# Patient Record
Sex: Female | Born: 1965 | Hispanic: No | Marital: Married | Smoking: Never smoker
Health system: Southern US, Community
[De-identification: ages and names within clinical notes are randomized; demographics above are authoritative.]

## PROBLEM LIST (undated history)

## (undated) DIAGNOSIS — R319 Hematuria, unspecified: Secondary | ICD-10-CM

## (undated) DIAGNOSIS — E785 Hyperlipidemia, unspecified: Secondary | ICD-10-CM

## (undated) DIAGNOSIS — E559 Vitamin D deficiency, unspecified: Secondary | ICD-10-CM

## (undated) DIAGNOSIS — L858 Other specified epidermal thickening: Secondary | ICD-10-CM

## (undated) DIAGNOSIS — E039 Hypothyroidism, unspecified: Secondary | ICD-10-CM

## (undated) HISTORY — DX: Other specified epidermal thickening: L85.8

## (undated) HISTORY — DX: Hematuria, unspecified: R31.9

## (undated) HISTORY — DX: Hyperlipidemia, unspecified: E78.5

## (undated) HISTORY — DX: Vitamin D deficiency, unspecified: E55.9

## (undated) HISTORY — DX: Hypothyroidism, unspecified: E03.9

---

## 2013-03-18 DIAGNOSIS — Z8639 Personal history of other endocrine, nutritional and metabolic disease: Secondary | ICD-10-CM | POA: Insufficient documentation

## 2013-10-14 LAB — HM MAMMOGRAPHY: HM MAMMO: NORMAL

## 2013-10-16 LAB — HM PAP SMEAR: HM Pap smear: NORMAL

## 2013-10-21 LAB — LIPID PANEL
Cholesterol: 188 mg/dL (ref 0–200)
HDL: 46 mg/dL (ref 35–70)
LDL Cholesterol: 92 mg/dL
Triglycerides: 252 mg/dL — AB (ref 40–160)

## 2014-10-13 ENCOUNTER — Telehealth: Payer: Self-pay | Admitting: Family Medicine

## 2014-10-13 ENCOUNTER — Other Ambulatory Visit: Payer: Self-pay | Admitting: Family Medicine

## 2014-10-13 DIAGNOSIS — Z79899 Other long term (current) drug therapy: Secondary | ICD-10-CM

## 2014-10-13 DIAGNOSIS — E785 Hyperlipidemia, unspecified: Secondary | ICD-10-CM

## 2014-10-13 DIAGNOSIS — E033 Postinfectious hypothyroidism: Secondary | ICD-10-CM

## 2014-10-13 DIAGNOSIS — E559 Vitamin D deficiency, unspecified: Secondary | ICD-10-CM

## 2014-10-13 NOTE — Telephone Encounter (Signed)
Patient is requesting lab work be done before physical, can you please order.

## 2014-10-13 NOTE — Telephone Encounter (Signed)
Pt is scheduled for her annual physical next week and is requesting to do her lab work this week. Please call when lab order is ready thank you  [phone]

## 2014-10-16 ENCOUNTER — Other Ambulatory Visit: Payer: Self-pay | Admitting: Family Medicine

## 2014-10-17 ENCOUNTER — Encounter: Payer: Self-pay | Admitting: Family Medicine

## 2014-10-17 LAB — COMPREHENSIVE METABOLIC PANEL
ALBUMIN: 4.2 g/dL (ref 3.5–5.5)
ALK PHOS: 54 IU/L (ref 39–117)
ALT: 21 IU/L (ref 0–32)
AST: 15 IU/L (ref 0–40)
Albumin/Globulin Ratio: 1.7 (ref 1.1–2.5)
BUN / CREAT RATIO: 22 (ref 9–23)
BUN: 14 mg/dL (ref 6–24)
Bilirubin Total: 0.4 mg/dL (ref 0.0–1.2)
CHLORIDE: 104 mmol/L (ref 97–108)
CO2: 21 mmol/L (ref 18–29)
Calcium: 9.2 mg/dL (ref 8.7–10.2)
Creatinine, Ser: 0.63 mg/dL (ref 0.57–1.00)
GFR calc Af Amer: 122 mL/min/{1.73_m2} (ref >59)
GFR, EST NON AFRICAN AMERICAN: 106 mL/min/{1.73_m2} (ref >59)
Globulin, Total: 2.5 g/dL (ref 1.5–4.5)
Glucose: 84 mg/dL (ref 65–99)
POTASSIUM: 5.3 mmol/L — AB (ref 3.5–5.2)
Sodium: 140 mmol/L (ref 134–144)
Total Protein: 6.7 g/dL (ref 6.0–8.5)

## 2014-10-17 LAB — LIPID PANEL
CHOLESTEROL TOTAL: 192 mg/dL (ref 100–199)
Chol/HDL Ratio: 4.3 ratio units (ref 0.0–4.4)
HDL: 45 mg/dL (ref >39)
LDL Calculated: 102 mg/dL — ABNORMAL HIGH (ref 0–99)
Triglycerides: 226 mg/dL — ABNORMAL HIGH (ref 0–149)
VLDL Cholesterol Cal: 45 mg/dL — ABNORMAL HIGH (ref 5–40)

## 2014-10-17 LAB — TSH: TSH: 5.67 u[IU]/mL — AB (ref 0.450–4.500)

## 2014-10-17 LAB — VITAMIN D 25 HYDROXY (VIT D DEFICIENCY, FRACTURES): Vit D, 25-Hydroxy: 26.1 ng/mL — ABNORMAL LOW (ref 30.0–100.0)

## 2014-10-18 ENCOUNTER — Encounter: Payer: Self-pay | Admitting: Family Medicine

## 2014-10-18 DIAGNOSIS — E785 Hyperlipidemia, unspecified: Secondary | ICD-10-CM | POA: Insufficient documentation

## 2014-10-18 DIAGNOSIS — E038 Other specified hypothyroidism: Secondary | ICD-10-CM | POA: Insufficient documentation

## 2014-10-18 DIAGNOSIS — L858 Other specified epidermal thickening: Secondary | ICD-10-CM | POA: Insufficient documentation

## 2014-10-18 DIAGNOSIS — R590 Localized enlarged lymph nodes: Secondary | ICD-10-CM | POA: Insufficient documentation

## 2014-10-18 DIAGNOSIS — E559 Vitamin D deficiency, unspecified: Secondary | ICD-10-CM | POA: Insufficient documentation

## 2014-10-18 DIAGNOSIS — E039 Hypothyroidism, unspecified: Secondary | ICD-10-CM | POA: Insufficient documentation

## 2014-10-21 ENCOUNTER — Encounter: Payer: Self-pay | Admitting: Family Medicine

## 2014-10-21 ENCOUNTER — Ambulatory Visit (INDEPENDENT_AMBULATORY_CARE_PROVIDER_SITE_OTHER): Payer: BLUE CROSS/BLUE SHIELD | Admitting: Family Medicine

## 2014-10-21 VITALS — BP 110/62 | HR 65 | Temp 98.2°F | Resp 14 | Ht 64.0 in | Wt 143.6 lb

## 2014-10-21 DIAGNOSIS — Z3040 Encounter for surveillance of contraceptives, unspecified: Secondary | ICD-10-CM

## 2014-10-21 DIAGNOSIS — Z1239 Encounter for other screening for malignant neoplasm of breast: Secondary | ICD-10-CM

## 2014-10-21 DIAGNOSIS — Z0000 Encounter for general adult medical examination without abnormal findings: Secondary | ICD-10-CM

## 2014-10-21 DIAGNOSIS — Z719 Counseling, unspecified: Secondary | ICD-10-CM

## 2014-10-21 DIAGNOSIS — Z1211 Encounter for screening for malignant neoplasm of colon: Secondary | ICD-10-CM

## 2014-10-21 DIAGNOSIS — Z7189 Other specified counseling: Secondary | ICD-10-CM

## 2014-10-21 DIAGNOSIS — E039 Hypothyroidism, unspecified: Secondary | ICD-10-CM

## 2014-10-21 DIAGNOSIS — Z124 Encounter for screening for malignant neoplasm of cervix: Secondary | ICD-10-CM | POA: Diagnosis not present

## 2014-10-21 DIAGNOSIS — E038 Other specified hypothyroidism: Secondary | ICD-10-CM | POA: Diagnosis not present

## 2014-10-21 DIAGNOSIS — Z01419 Encounter for gynecological examination (general) (routine) without abnormal findings: Secondary | ICD-10-CM

## 2014-10-21 MED ORDER — ASPIRIN EC 81 MG PO TBEC: 81.0000 mg | DELAYED_RELEASE_TABLET | Freq: Every day | ORAL | Status: DC

## 2014-10-21 MED ORDER — SPRINTEC 28 0.25-35 MG-MCG PO TABS: 1.0000 | ORAL_TABLET | Freq: Every day | ORAL | Status: DC

## 2014-10-21 NOTE — Patient Instructions (Signed)
Discussed importance of 150 minutes of physical activity weekly, eat two servings of fish weekly, eat one serving of tree nuts ( cashews, pistachios, pecans, almonds.Marland Kitchen) every other day, eat 6 servings of fruit/vegetables daily and drink plenty of water and avoid sweet beverages.  Start Aspirin 81 mg daily Contact insurance to find out about Cologuard cost, colonoscopy screening or hemoccult cards, call me back with your option Call back for TSH level in 6 months

## 2014-10-21 NOTE — Progress Notes (Signed)
Name: Autumn Long   MRN: [id number]    DOB: 1966-04-01   Date:10/21/2014       Progress Note  Subjective  Chief Complaint  Chief Complaint  Patient presents with  . Annual Exam    HPI  Well Woman Exam: she has been doing well, she denies fatigue, no chest pain, no sob, no constipation, no dry skin, no weight gain. She has a history of thyroiditis and last TSH was elevated,but since no symptoms we will hold off on medication and recheck in 6 months  Patient Active Problem List   Diagnosis Date Noted  . Dyslipidemia 10/18/2014  . Keratosis pilaris 10/18/2014  . Subclinical hypothyroidism 10/18/2014  . Vitamin D deficiency 10/18/2014  . History of Hashimoto thyroiditis 03/18/2013    Past Surgical History  Procedure Laterality Date  . Cesarean section      Family History  Problem Relation Age of Onset  . Heart disease Father     History   Social History  . Marital Status: Married    Spouse Name: N/A  . Number of Children: N/A  . Years of Education: N/A   Occupational History  . Not on file.   Social History Main Topics  . Smoking status: Never Smoker   . Smokeless tobacco: Never Used  . Alcohol Use: 0.0 oz/week    0 Standard drinks or equivalent per week     Comment: occasionally  . Drug Use: No  . Sexual Activity: Yes   Other Topics Concern  . Not on file   Social History Narrative     Current outpatient prescriptions:  .  cholecalciferol (VITAMIN D) 1000 UNITS tablet, Take 1,000 Units by mouth daily., Disp: , Rfl:  .  SPRINTEC 28 0.25-35 MG-MCG tablet, Take 1 tablet by mouth daily., Disp: 3 Package, Rfl: 4  No Known Allergies   ROS  Constitutional: Negative for fever or weight change.  Respiratory: Negative for cough and shortness of breath.   Cardiovascular: Negative for chest pain or palpitations.  Gastrointestinal: Negative for abdominal pain, no bowel changes.  Musculoskeletal: Negative for gait problem or joint swelling.  Skin:  Negative for rash.  Neurological: Negative for dizziness or headache.  No other specific complaints in a complete review of systems (except as listed in HPI above).  Objective  Filed Vitals:   10/21/14 1509  BP: 110/62  Pulse: 65  Temp: 98.2 F (36.8 C)  TempSrc: Oral  Resp: 14  Height: 5\' 4"  (1.626 m)  Weight: 143 lb 9.6 oz (65.137 kg)  SpO2: 99%    Body mass index is 24.64 kg/(m^2).  Physical Exam   Constitutional: Patient appears well-developed and well-nourished. No distress.  HENT: Head: Normocephalic and atraumatic. Ears: B TMs ok, no erythema or effusion; Nose: Nose normal. Mouth/Throat: Oropharynx is clear and moist. No oropharyngeal exudate.  Eyes: Conjunctivae and EOM are normal. Pupils are equal, round, and reactive to light. No scleral icterus.  Neck: Normal range of motion. Neck supple. No JVD present. No thyromegaly present.  Cardiovascular: Normal rate, regular rhythm and normal heart sounds.  No murmur heard. No BLE edema. Pulmonary/Chest: Effort normal and breath sounds normal. No respiratory distress. Abdominal: Soft. Bowel sounds are normal, no distension. There is no tenderness. no masses Breast: no lumps or masses, no nipple discharge or rashes FEMALE GENITALIA:  External genitalia normal External urethra normal RECTAL: no rectal masses or hemorrhoids Musculoskeletal: Normal range of motion, no joint effusions. No gross deformities Neurological: he is alert and  oriented to person, place, and time. No cranial nerve deficit. Coordination, balance, strength, speech and gait are normal.  Skin: Skin is warm and dry. No rash noted. No erythema.  Psychiatric: Patient has a normal mood and affect. behavior is normal. Judgment and thought content normal.  Recent Results (from the past 2160 hour(s))  Comprehensive metabolic panel     Status: Abnormal   Collection Time: 10/16/14  8:54 AM  Result Value Ref Range   Glucose 84 65 - 99 mg/dL   BUN 14 6 - 24 mg/dL    Creatinine, Ser 0.63 0.57 - 1.00 mg/dL   GFR calc non Af Amer 106 >59 mL/min/1.73   GFR calc Af Amer 122 >59 mL/min/1.73   BUN/Creatinine Ratio 22 9 - 23   Sodium 140 134 - 144 mmol/L   Potassium 5.3 (H) 3.5 - 5.2 mmol/L   Chloride 104 97 - 108 mmol/L   CO2 21 18 - 29 mmol/L   Calcium 9.2 8.7 - 10.2 mg/dL   Total Protein 6.7 6.0 - 8.5 g/dL   Albumin 4.2 3.5 - 5.5 g/dL   Globulin, Total 2.5 1.5 - 4.5 g/dL   Albumin/Globulin Ratio 1.7 1.1 - 2.5   Bilirubin Total 0.4 0.0 - 1.2 mg/dL   Alkaline Phosphatase 54 39 - 117 IU/L   AST 15 0 - 40 IU/L   ALT 21 0 - 32 IU/L  Lipid panel     Status: Abnormal   Collection Time: 10/16/14  8:54 AM  Result Value Ref Range   Cholesterol, Total 192 100 - 199 mg/dL   Triglycerides 226 (H) 0 - 149 mg/dL   HDL 45 >39 mg/dL    Comment: According to ATP-III Guidelines, HDL-C >59 mg/dL is considered a negative risk factor for CHD.    VLDL Cholesterol Cal 45 (H) 5 - 40 mg/dL   LDL Calculated 102 (H) 0 - 99 mg/dL   Chol/HDL Ratio 4.3 0.0 - 4.4 ratio units    Comment:                                   T. Chol/HDL Ratio                                             Men  Women                               1/2 Avg.Risk  3.4    3.3                                   Avg.Risk  5.0    4.4                                2X Avg.Risk  9.6    7.1                                3X Avg.Risk 23.4   11.0   TSH     Status: Abnormal   Collection Time: 10/16/14  8:54 AM  Result Value  Ref Range   TSH 5.670 (H) 0.450 - 4.500 uIU/mL  Vit D  25 hydroxy (rtn osteoporosis monitoring)     Status: Abnormal   Collection Time: 10/16/14  8:54 AM  Result Value Ref Range   Vit D, 25-Hydroxy 26.1 (L) 30.0 - 100.0 ng/mL    Comment: Vitamin D deficiency has been defined by the [place] practice guideline as a level of serum 25-OH vitamin D less than 20 ng/mL (1,2). The Endocrine Society went on to further define vitamin D insufficiency as a  level between 21 and 29 ng/mL (2). 1. IOM (Institute of Medicine). 2010. Dietary reference    intakes for calcium and D. [place]: The    [employer]. 2. Holick MF, Binkley [place], Bischoff-Ferrari HA, et al.    Evaluation, treatment, and prevention of vitamin D    deficiency: an Endocrine Society clinical practice    guideline. JCEM. 2011 Jul; 96(7):1911-30.      PHQ2/9: Depression screen PHQ 2/9 10/21/2014  Decreased Interest 0  Down, Depressed, Hopeless 0  PHQ - 2 Score 0     Fall Risk: Fall Risk  10/21/2014  Falls in the past year? No     Assessment & Plan  1. Well woman exam Discussed importance of 150 minutes of physical activity weekly, eat two servings of fish weekly, eat one serving of tree nuts ( cashews, pistachios, pecans, almonds.Marland Kitchen) every other day, eat 6 servings of fruit/vegetables daily and drink plenty of water and avoid sweet beverages. Start aspirin 81mg  daily . Mammogram is up to date  2. Subclinical hypothyroidism We will hold off on medication at this time  3. Encounter for surveillance of contraceptives  - Victor 28 0.25-35 MG-MCG tablet; Take 1 tablet by mouth daily.  Dispense: 3 Package; Refill: 4   4. Health counseling See above  5. Colon cancer screening Discussed options and she will call back with her choice after she contacts her insurance  6. Cervical cancer screening Up to date, recheck in 3 years

## 2014-11-13 ENCOUNTER — Encounter: Payer: Self-pay | Admitting: Family Medicine

## 2015-10-13 ENCOUNTER — Telehealth: Payer: Self-pay | Admitting: Family Medicine

## 2015-10-13 DIAGNOSIS — E785 Hyperlipidemia, unspecified: Secondary | ICD-10-CM

## 2015-10-13 DIAGNOSIS — Z131 Encounter for screening for diabetes mellitus: Secondary | ICD-10-CM

## 2015-10-13 DIAGNOSIS — Z130 Encounter for screening for diseases of the blood and blood-forming organs and certain disorders involving the immune mechanism: Secondary | ICD-10-CM

## 2015-10-13 DIAGNOSIS — E039 Hypothyroidism, unspecified: Secondary | ICD-10-CM

## 2015-10-13 DIAGNOSIS — Z01419 Encounter for gynecological examination (general) (routine) without abnormal findings: Secondary | ICD-10-CM

## 2015-10-13 DIAGNOSIS — E038 Other specified hypothyroidism: Secondary | ICD-10-CM

## 2015-10-13 DIAGNOSIS — Z8639 Personal history of other endocrine, nutritional and metabolic disease: Secondary | ICD-10-CM

## 2015-10-13 DIAGNOSIS — E559 Vitamin D deficiency, unspecified: Secondary | ICD-10-CM

## 2015-10-13 NOTE — Telephone Encounter (Signed)
Have annual cpe for 10-23-15 and is requesting to pick up lab order around 4p this afternoon.

## 2015-10-14 ENCOUNTER — Other Ambulatory Visit: Payer: Self-pay | Admitting: Family Medicine

## 2015-10-15 LAB — COMPREHENSIVE METABOLIC PANEL
A/G RATIO: 1.5 (ref 1.2–2.2)
ALBUMIN: 4.1 g/dL (ref 3.5–5.5)
ALK PHOS: 54 IU/L (ref 39–117)
ALT: 27 IU/L (ref 0–32)
AST: 24 IU/L (ref 0–40)
BILIRUBIN TOTAL: 0.6 mg/dL (ref 0.0–1.2)
BUN / CREAT RATIO: 19 (ref 9–23)
BUN: 12 mg/dL (ref 6–24)
CHLORIDE: 102 mmol/L (ref 96–106)
CO2: 19 mmol/L (ref 18–29)
Calcium: 8.8 mg/dL (ref 8.7–10.2)
Creatinine, Ser: 0.64 mg/dL (ref 0.57–1.00)
GFR calc Af Amer: 120 mL/min/{1.73_m2} (ref >59)
GFR calc non Af Amer: 104 mL/min/{1.73_m2} (ref >59)
Globulin, Total: 2.7 g/dL (ref 1.5–4.5)
Glucose: 80 mg/dL (ref 65–99)
POTASSIUM: 4.3 mmol/L (ref 3.5–5.2)
SODIUM: 138 mmol/L (ref 134–144)
TOTAL PROTEIN: 6.8 g/dL (ref 6.0–8.5)

## 2015-10-15 LAB — CBC
HEMATOCRIT: 40.4 % (ref 34.0–46.6)
Hemoglobin: 13.6 g/dL (ref 11.1–15.9)
MCH: 28.5 pg (ref 26.6–33.0)
MCHC: 33.7 g/dL (ref 31.5–35.7)
MCV: 85 fL (ref 79–97)
PLATELETS: 192 10*3/uL (ref 150–379)
RBC: 4.77 x10E6/uL (ref 3.77–5.28)
RDW: 14.1 % (ref 12.3–15.4)
WBC: 6.6 10*3/uL (ref 3.4–10.8)

## 2015-10-15 LAB — LIPID PANEL
CHOLESTEROL TOTAL: 185 mg/dL (ref 100–199)
Chol/HDL Ratio: 4.2 ratio units (ref 0.0–4.4)
HDL: 44 mg/dL (ref >39)
LDL Calculated: 76 mg/dL (ref 0–99)
TRIGLYCERIDES: 327 mg/dL — AB (ref 0–149)
VLDL Cholesterol Cal: 65 mg/dL — ABNORMAL HIGH (ref 5–40)

## 2015-10-15 LAB — HEMOGLOBIN A1C
Est. average glucose Bld gHb Est-mCnc: 103 mg/dL
Hgb A1c MFr Bld: 5.2 % (ref 4.8–5.6)

## 2015-10-15 LAB — VITAMIN D 25 HYDROXY (VIT D DEFICIENCY, FRACTURES): Vit D, 25-Hydroxy: 27.6 ng/mL — ABNORMAL LOW (ref 30.0–100.0)

## 2015-10-16 ENCOUNTER — Encounter: Payer: Self-pay | Admitting: Family Medicine

## 2015-10-21 ENCOUNTER — Telehealth: Payer: Self-pay

## 2015-10-21 NOTE — Telephone Encounter (Signed)
Patient wanted to know if you can add TSH to the labs she recently had done.

## 2015-10-21 NOTE — Telephone Encounter (Signed)
It was ordered, can you please add ?

## 2015-10-21 NOTE — Telephone Encounter (Signed)
[facility] was called to add on the requested test.

## 2015-10-22 LAB — SPECIMEN STATUS REPORT

## 2015-10-22 LAB — TSH: TSH: 4.77 u[IU]/mL — AB (ref 0.450–4.500)

## 2015-10-23 ENCOUNTER — Ambulatory Visit (INDEPENDENT_AMBULATORY_CARE_PROVIDER_SITE_OTHER): Payer: BLUE CROSS/BLUE SHIELD | Admitting: Family Medicine

## 2015-10-23 ENCOUNTER — Encounter: Payer: Self-pay | Admitting: Family Medicine

## 2015-10-23 VITALS — BP 116/74 | HR 70 | Temp 97.7°F | Resp 16 | Wt 150.0 lb

## 2015-10-23 DIAGNOSIS — Z8639 Personal history of other endocrine, nutritional and metabolic disease: Secondary | ICD-10-CM

## 2015-10-23 DIAGNOSIS — E781 Pure hyperglyceridemia: Secondary | ICD-10-CM

## 2015-10-23 DIAGNOSIS — E038 Other specified hypothyroidism: Secondary | ICD-10-CM | POA: Diagnosis not present

## 2015-10-23 DIAGNOSIS — Q829 Congenital malformation of skin, unspecified: Secondary | ICD-10-CM

## 2015-10-23 DIAGNOSIS — Z3040 Encounter for surveillance of contraceptives, unspecified: Secondary | ICD-10-CM

## 2015-10-23 DIAGNOSIS — Z0000 Encounter for general adult medical examination without abnormal findings: Secondary | ICD-10-CM | POA: Diagnosis not present

## 2015-10-23 DIAGNOSIS — E039 Hypothyroidism, unspecified: Secondary | ICD-10-CM

## 2015-10-23 DIAGNOSIS — Z1211 Encounter for screening for malignant neoplasm of colon: Secondary | ICD-10-CM | POA: Diagnosis not present

## 2015-10-23 DIAGNOSIS — Z01419 Encounter for gynecological examination (general) (routine) without abnormal findings: Secondary | ICD-10-CM

## 2015-10-23 DIAGNOSIS — L858 Other specified epidermal thickening: Secondary | ICD-10-CM

## 2015-10-23 MED ORDER — TRIAMCINOLONE ACETONIDE 0.1 % EX CREA: 1.0000 "application " | TOPICAL_CREAM | Freq: Two times a day (BID) | CUTANEOUS | Status: DC

## 2015-10-23 MED ORDER — PIMECROLIMUS 1 % EX CREA: TOPICAL_CREAM | Freq: Two times a day (BID) | CUTANEOUS | Status: DC

## 2015-10-23 MED ORDER — SPRINTEC 28 0.25-35 MG-MCG PO TABS: 1.0000 | ORAL_TABLET | Freq: Every day | ORAL | Status: DC

## 2015-10-23 MED ORDER — OMEGA-3-ACID ETHYL ESTERS 1 G PO CAPS: 2.0000 g | ORAL_CAPSULE | Freq: Two times a day (BID) | ORAL | Status: DC

## 2015-10-23 NOTE — Progress Notes (Signed)
Name: Autumn Long   MRN: [id number]    DOB: September 30, 1965   Date:10/23/2015       Progress Note  Subjective  Chief Complaint  Chief Complaint  Patient presents with  . Annual Exam    no PAP  . Orders    colorguard  . Advice Only    lab review    HPI  Well Woman: she is feeling well, she denies hot flashes or night sweats, cycles are monthly and would like to continue ocp until age 50. No vaginal dryness or pain during intercourse, no urinary symptoms  Subclinical hypothyroidism: she was diagnosed with Hashimoto's thyroiditis, last TSH has improved, still slightly elevated, but not symptoms of hypothyroidism. Denies constipation, dry skin ( except arms from eczema during Summer ), no fatigue or significant weight gain  Hypertriglyceridemia: she is very concerned about level, she would like to start medication. She is on a low sugar diet, she exercises, LDL has improved, HDL is above 40.   Keratosis Pilaris: she would like a refill of Triamcinolone, she has noticed that symptoms are present on both arms after sun exposure, discussed long sleeves.   Vitamin D deficiency: continue otc supplementation but try taking 2000 units daily    Patient Active Problem List   Diagnosis Date Noted  . Dyslipidemia 10/18/2014  . Keratosis pilaris 10/18/2014  . Subclinical hypothyroidism 10/18/2014  . Vitamin D deficiency 10/18/2014  . History of Hashimoto thyroiditis 03/18/2013    Past Surgical History  Procedure Laterality Date  . Cesarean section      Family History  Problem Relation Age of Onset  . Heart disease Father     Social History   Social History  . Marital Status: Married    Spouse Name: N/A  . Number of Children: N/A  . Years of Education: N/A   Occupational History  . Not on file.   Social History Main Topics  . Smoking status: Never Smoker   . Smokeless tobacco: Never Used  . Alcohol Use: 0.0 oz/week    0 Standard drinks or equivalent per week      Comment: occasionally  . Drug Use: No  . Sexual Activity:    Partners: Male   Other Topics Concern  . Not on file   Social History Narrative     Current outpatient prescriptions:  .  aspirin EC 81 MG tablet, Take 1 tablet (81 mg total) by mouth daily., Disp: 30 tablet, Rfl: 0 .  cholecalciferol (VITAMIN D) 1000 UNITS tablet, Take 2,000 Units by mouth daily., Disp: , Rfl:  .  SPRINTEC 28 0.25-35 MG-MCG tablet, Take 1 tablet by mouth daily., Disp: 3 Package, Rfl: 4 .  omega-3 acid ethyl esters (LOVAZA) 1 g capsule, Take 2 capsules (2 g total) by mouth 2 (two) times daily., Disp: 60 capsule, Rfl: 5 .  pimecrolimus (ELIDEL) 1 % cream, Apply topically 2 (two) times daily., Disp: 100 g, Rfl: 2 .  triamcinolone cream (KENALOG) 0.1 %, Apply 1 application topically 2 (two) times daily., Disp: 453.6 g, Rfl: 1  No Known Allergies   ROS  Constitutional: Negative for fever , positive for mild  weight change.  Respiratory: Negative for cough and shortness of breath.   Cardiovascular: Negative for chest pain or palpitations.  Gastrointestinal: Negative for abdominal pain, no bowel changes.  Musculoskeletal: Negative for gait problem or joint swelling.  Skin: Positive  for rash.  Neurological: Negative for dizziness or headache.  No other specific complaints in a complete  review of systems (except as listed in HPI above).  Objective  Filed Vitals:   10/23/15 0830  BP: 116/74  Pulse: 70  Temp: 97.7 F (36.5 C)  TempSrc: Oral  Resp: 16  Weight: 150 lb (68.04 kg)  SpO2: 98%    Body mass index is 25.73 kg/(m^2).  Physical Exam  Constitutional: Patient appears well-developed and well-nourished. No distress.  HENT: Head: Normocephalic and atraumatic. Ears: B TMs ok, no erythema or effusion; Nose: Nose normal. Mouth/Throat: Oropharynx is clear and moist. No oropharyngeal exudate.  Eyes: Conjunctivae and EOM are normal. Pupils are equal, round, and reactive to light. No scleral icterus.   Neck: Normal range of motion. Neck supple. No JVD present. No thyromegaly present.  Cardiovascular: Normal rate, regular rhythm and normal heart sounds.  No murmur heard. No BLE edema. Pulmonary/Chest: Effort normal and breath sounds normal. No respiratory distress. Abdominal: Soft. Bowel sounds are normal, no distension. There is no tenderness. no masses Breast: not done FEMALE GENITALIA:  Not done RECTAL: not done Musculoskeletal: Normal range of motion, no joint effusions. No gross deformities Neurological: he is alert and oriented to person, place, and time. No cranial nerve deficit. Coordination, balance, strength, speech and gait are normal.  Skin: Skin is warm and dry. No rash noted. No erythema.  Psychiatric: Patient has a normal mood and affect. behavior is normal. Judgment and thought content normal.  Recent Results (from the past 2160 hour(s))  Comprehensive metabolic panel     Status: None   Collection Time: 10/14/15  7:15 AM  Result Value Ref Range   Glucose 80 65 - 99 mg/dL   BUN 12 6 - 24 mg/dL   Creatinine, Ser 0.64 0.57 - 1.00 mg/dL   GFR calc non Af Amer 104 >59 mL/min/1.73   GFR calc Af Amer 120 >59 mL/min/1.73   BUN/Creatinine Ratio 19 9 - 23   Sodium 138 134 - 144 mmol/L   Potassium 4.3 3.5 - 5.2 mmol/L   Chloride 102 96 - 106 mmol/L   CO2 19 18 - 29 mmol/L   Calcium 8.8 8.7 - 10.2 mg/dL   Total Protein 6.8 6.0 - 8.5 g/dL   Albumin 4.1 3.5 - 5.5 g/dL   Globulin, Total 2.7 1.5 - 4.5 g/dL   Albumin/Globulin Ratio 1.5 1.2 - 2.2   Bilirubin Total 0.6 0.0 - 1.2 mg/dL   Alkaline Phosphatase 54 39 - 117 IU/L   AST 24 0 - 40 IU/L   ALT 27 0 - 32 IU/L  CBC     Status: None   Collection Time: 10/14/15  7:15 AM  Result Value Ref Range   WBC 6.6 3.4 - 10.8 x10E3/uL   RBC 4.77 3.77 - 5.28 x10E6/uL   Hemoglobin 13.6 11.1 - 15.9 g/dL   Hematocrit 40.4 34.0 - 46.6 %   MCV 85 79 - 97 fL   MCH 28.5 26.6 - 33.0 pg   MCHC 33.7 31.5 - 35.7 g/dL   RDW 14.1 12.3 - 15.4 %    Platelets 192 150 - 379 x10E3/uL  Lipid panel     Status: Abnormal   Collection Time: 10/14/15  7:15 AM  Result Value Ref Range   Cholesterol, Total 185 100 - 199 mg/dL   Triglycerides 327 (H) 0 - 149 mg/dL   HDL 44 >39 mg/dL   VLDL Cholesterol Cal 65 (H) 5 - 40 mg/dL   LDL Calculated 76 0 - 99 mg/dL   Chol/HDL Ratio 4.2 0.0 - 4.4 ratio  units    Comment:                                   T. Chol/HDL Ratio                                             Men  Women                               1/2 Avg.Risk  3.4    3.3                                   Avg.Risk  5.0    4.4                                2X Avg.Risk  9.6    7.1                                3X Avg.Risk 23.4   11.0   Hemoglobin A1c     Status: None   Collection Time: 10/14/15  7:15 AM  Result Value Ref Range   Hgb A1c MFr Bld 5.2 4.8 - 5.6 %    Comment:          Pre-diabetes: 5.7 - 6.4          Diabetes: >6.4          Glycemic control for adults with diabetes: <7.0    Est. average glucose Bld gHb Est-mCnc 103 mg/dL  VITAMIN D 25 Hydroxy (Vit-D Deficiency, Fractures)     Status: Abnormal   Collection Time: 10/14/15  7:15 AM  Result Value Ref Range   Vit D, 25-Hydroxy 27.6 (L) 30.0 - 100.0 ng/mL    Comment: Vitamin D deficiency has been defined by the [place] and an Endocrine Society practice guideline as a level of serum 25-OH vitamin D less than 20 ng/mL (1,2). The Endocrine Society went on to further define vitamin D insufficiency as a level between 21 and 29 ng/mL (2). 1. IOM (Institute of Medicine). 2010. Dietary reference    intakes for calcium and D. [place]: The    [employer]. 2. Holick MF, Binkley [place], Bischoff-Ferrari HA, et al.    Evaluation, treatment, and prevention of vitamin D    deficiency: an Endocrine Society clinical practice    guideline. JCEM. 2011 Jul; 96(7):1911-30.   TSH     Status: Abnormal   Collection Time: 10/14/15  7:15 AM  Result Value Ref Range    TSH 4.770 (H) 0.450 - 4.500 uIU/mL  Specimen status report     Status: None (Preliminary result)   Collection Time: 10/14/15  7:15 AM  Result Value Ref Range   specimen status report Comment     Comment: Written Authorization Written Authorization       PHQ2/9: Depression screen [facility] 2/9 10/23/2015 10/21/2014  Decreased Interest 0 0  Down, Depressed, Hopeless 0 0  PHQ - 2 Score 0 0    Fall Risk: Fall Risk  10/23/2015 10/21/2014  Falls in the past year? No No    Functional Status Survey: Is the patient deaf or have difficulty hearing?: No Does the patient have difficulty seeing, even when wearing glasses/contacts?: No Does the patient have difficulty concentrating, remembering, or making decisions?: No Does the patient have difficulty walking or climbing stairs?: No Does the patient have difficulty dressing or bathing?: No Does the patient have difficulty doing errands alone such as visiting a doctor's office or shopping?: No   Assessment & Plan  1. Well woman exam  Discussed importance of 150 minutes of physical activity weekly, eat two servings of fish weekly, eat one serving of tree nuts ( cashews, pistachios, pecans, almonds.Marland Kitchen) every other day, eat 6 servings of fruit/vegetables daily and drink plenty of water and avoid sweet beverages.   2. Colon cancer screening  - Ambulatory referral to Gastroenterology  3. History of Hashimoto thyroiditis  Doing well   4. Hypertriglyceridemia  - omega-3 acid ethyl esters (LOVAZA) 1 g capsule; Take 2 capsules (2 g total) by mouth 2 (two) times daily.  Dispense: 60 capsule; Refill: 5  5. Subclinical hypothyroidism  We will continue to monitor  6. Keratosis pilaris  - pimecrolimus (ELIDEL) 1 % cream; Apply topically 2 (two) times daily.  Dispense: 100 g; Refill: 2 - triamcinolone cream (KENALOG) 0.1 %; Apply 1 application topically 2 (two) times daily.  Dispense: 453.6 g; Refill: 1

## 2015-10-26 ENCOUNTER — Telehealth: Payer: Self-pay

## 2015-10-26 NOTE — Telephone Encounter (Signed)
Erroneous Entry  

## 2015-11-04 ENCOUNTER — Telehealth: Payer: Self-pay

## 2015-11-04 NOTE — Telephone Encounter (Signed)
Colonoscopy has been scheduled with [facility] for 11/16/15 @ 9:30am, but patient stated that date was not good for her so their number was given to her so she could reschedule.

## 2016-03-29 ENCOUNTER — Telehealth: Payer: Self-pay | Admitting: Family Medicine

## 2016-03-29 NOTE — Telephone Encounter (Signed)
Pt has an appt on 04/15/16 and needs a lab order to do her labs.

## 2016-03-30 ENCOUNTER — Other Ambulatory Visit: Payer: Self-pay | Admitting: Family Medicine

## 2016-03-30 DIAGNOSIS — E039 Hypothyroidism, unspecified: Secondary | ICD-10-CM

## 2016-03-30 DIAGNOSIS — E038 Other specified hypothyroidism: Secondary | ICD-10-CM

## 2016-03-30 DIAGNOSIS — Z8639 Personal history of other endocrine, nutritional and metabolic disease: Secondary | ICD-10-CM

## 2016-03-30 DIAGNOSIS — E781 Pure hyperglyceridemia: Secondary | ICD-10-CM

## 2016-03-30 DIAGNOSIS — E559 Vitamin D deficiency, unspecified: Secondary | ICD-10-CM

## 2016-03-30 NOTE — Telephone Encounter (Signed)
Please print labs. Ordered today. Thank you

## 2016-03-31 ENCOUNTER — Other Ambulatory Visit: Payer: Self-pay

## 2016-03-31 DIAGNOSIS — E038 Other specified hypothyroidism: Secondary | ICD-10-CM

## 2016-03-31 DIAGNOSIS — E559 Vitamin D deficiency, unspecified: Secondary | ICD-10-CM

## 2016-03-31 DIAGNOSIS — E781 Pure hyperglyceridemia: Secondary | ICD-10-CM

## 2016-03-31 DIAGNOSIS — Z8639 Personal history of other endocrine, nutritional and metabolic disease: Secondary | ICD-10-CM

## 2016-03-31 DIAGNOSIS — E039 Hypothyroidism, unspecified: Secondary | ICD-10-CM

## 2016-03-31 NOTE — Telephone Encounter (Signed)
Patient notified and mailed a copy of her labs to her house.

## 2016-04-09 LAB — LIPID PANEL W/O CHOL/HDL RATIO
Cholesterol, Total: 188 mg/dL (ref 100–199)
HDL: 55 mg/dL (ref >39)
LDL Calculated: 99 mg/dL (ref 0–99)
Triglycerides: 171 mg/dL — ABNORMAL HIGH (ref 0–149)
VLDL CHOLESTEROL CAL: 34 mg/dL (ref 5–40)

## 2016-04-09 LAB — SPECIMEN STATUS REPORT

## 2016-04-09 LAB — TSH+FREE T4
FREE T4: 1.18 ng/dL (ref 0.82–1.77)
TSH: 4.55 u[IU]/mL — AB (ref 0.450–4.500)

## 2016-04-09 LAB — VITAMIN D 25 HYDROXY (VIT D DEFICIENCY, FRACTURES): Vit D, 25-Hydroxy: 35.6 ng/mL (ref 30.0–100.0)

## 2016-04-15 ENCOUNTER — Ambulatory Visit: Payer: BLUE CROSS/BLUE SHIELD | Admitting: Family Medicine

## 2016-04-15 ENCOUNTER — Ambulatory Visit (INDEPENDENT_AMBULATORY_CARE_PROVIDER_SITE_OTHER): Payer: BLUE CROSS/BLUE SHIELD | Admitting: Family Medicine

## 2016-04-15 ENCOUNTER — Encounter: Payer: Self-pay | Admitting: Family Medicine

## 2016-04-15 VITALS — BP 124/60 | HR 76 | Temp 98.2°F | Resp 14 | Wt 150.0 lb

## 2016-04-15 DIAGNOSIS — L309 Dermatitis, unspecified: Secondary | ICD-10-CM

## 2016-04-15 DIAGNOSIS — E781 Pure hyperglyceridemia: Secondary | ICD-10-CM | POA: Diagnosis not present

## 2016-04-15 DIAGNOSIS — E039 Hypothyroidism, unspecified: Secondary | ICD-10-CM

## 2016-04-15 DIAGNOSIS — Z8639 Personal history of other endocrine, nutritional and metabolic disease: Secondary | ICD-10-CM

## 2016-04-15 DIAGNOSIS — Z3041 Encounter for surveillance of contraceptive pills: Secondary | ICD-10-CM | POA: Diagnosis not present

## 2016-04-15 DIAGNOSIS — L858 Other specified epidermal thickening: Secondary | ICD-10-CM | POA: Diagnosis not present

## 2016-04-15 DIAGNOSIS — E038 Other specified hypothyroidism: Secondary | ICD-10-CM

## 2016-04-15 MED ORDER — PIMECROLIMUS 1 % EX CREA: TOPICAL_CREAM | Freq: Two times a day (BID) | CUTANEOUS | 2 refills | Status: DC

## 2016-04-15 MED ORDER — OMEGA-3-ACID ETHYL ESTERS 1 G PO CAPS: 2.0000 g | ORAL_CAPSULE | Freq: Two times a day (BID) | ORAL | 5 refills | Status: DC

## 2016-04-15 MED ORDER — SPRINTEC 28 0.25-35 MG-MCG PO TABS: 1.0000 | ORAL_TABLET | Freq: Every day | ORAL | 4 refills | Status: DC

## 2016-04-15 NOTE — Progress Notes (Signed)
Name: Autumn Long   MRN: [id number]    DOB: 1965-09-11   Date:04/15/2016       Progress Note  Subjective  Chief Complaint  Chief Complaint  Patient presents with  . Follow-up    6 month  . vitamin D  . Rash    around mouth    HPI  Subclinical hypothyroidism: she was diagnosed with Hashimoto's thyroiditis years ago , last TSH improved,  still slightly elevated, but not symptoms of hypothyroidism. Denies constipation, dry skin ( except arms from eczema during Summer ), no fatigue and weight is stable.   Hypertriglyceridemia: she started on Lovaza June 2017, triglycerides is much better, only taking 1 g twice daily so we will increase to up to 2 g twice daily   Contact dermatitis face: started witch chapped lips a few days ago, she started to lick her lips and now has pilling rash and hyperpigmentation   Vitamin D deficiency: continue otc supplementation but try taking 2000 units daily     Patient Active Problem List   Diagnosis Date Noted  . Dyslipidemia 10/18/2014  . Keratosis pilaris 10/18/2014  . Subclinical hypothyroidism 10/18/2014  . Vitamin D deficiency 10/18/2014  . History of Hashimoto thyroiditis 03/18/2013    Past Surgical History:  Procedure Laterality Date  . CESAREAN SECTION      Family History  Problem Relation Age of Onset  . Heart disease Father     Social History   Social History  . Marital status: Married    Spouse name: N/A  . Number of children: N/A  . Years of education: N/A   Occupational History  . Not on file.   Social History Main Topics  . Smoking status: Never Smoker  . Smokeless tobacco: Never Used  . Alcohol use 0.0 oz/week     Comment: occasionally  . Drug use: No  . Sexual activity: Yes    Partners: Male   Other Topics Concern  . Not on file   Social History Narrative  . No narrative on file     Current Outpatient Prescriptions:  .  aspirin EC 81 MG tablet, Take 1 tablet (81 mg total) by mouth daily.,  Disp: 30 tablet, Rfl: 0 .  cholecalciferol (VITAMIN D) 1000 UNITS tablet, Take 2,000 Units by mouth daily., Disp: , Rfl:  .  omega-3 acid ethyl esters (LOVAZA) 1 g capsule, Take 2 capsules (2 g total) by mouth 2 (two) times daily., Disp: 120 capsule, Rfl: 5 .  pimecrolimus (ELIDEL) 1 % cream, Apply topically 2 (two) times daily., Disp: 100 g, Rfl: 2 .  SPRINTEC 28 0.25-35 MG-MCG tablet, Take 1 tablet by mouth daily., Disp: 3 Package, Rfl: 4  No Known Allergies   ROS  Ten systems reviewed and is negative except as mentioned in HPI   Objective  Vitals:   04/15/16 1509  BP: 124/60  Pulse: 76  Resp: 14  Temp: 98.2 F (36.8 C)  TempSrc: Oral  SpO2: 98%  Weight: 150 lb (68 kg)    Body mass index is 25.75 kg/m.  Physical Exam  Constitutional: Patient appears well-developed and well-nourished. Obese No distress.  HEENT: head atraumatic, normocephalic, pupils equal and reactive to light, neck supple, throat within normal limits Cardiovascular: Normal rate, regular rhythm and normal heart sounds.  No murmur heard. No BLE edema. Pulmonary/Chest: Effort normal and breath sounds normal. No respiratory distress. Abdominal: Soft.  There is no tenderness. Psychiatric: Patient has a normal mood and affect. behavior is normal.  Judgment and thought content normal. Skin: hyperpigmentation and dryness around her lips  Recent Results (from the past 2160 hour(s))  VITAMIN D 25 Hydroxy (Vit-D Deficiency, Fractures)     Status: None   Collection Time: 04/06/16 12:00 AM  Result Value Ref Range   Vit D, 25-Hydroxy 35.6 30.0 - 100.0 ng/mL    Comment: Vitamin D deficiency has been defined by the [place] practice guideline as a level of serum 25-OH vitamin D less than 20 ng/mL (1,2). The Endocrine Society went on to further define vitamin D insufficiency as a level between 21 and 29 ng/mL (2). 1. IOM (Institute of Medicine). 2010. Dietary reference     intakes for calcium and D. [place]: The    [employer]. 2. Holick MF, Binkley [place], Bischoff-Ferrari HA, et al.    Evaluation, treatment, and prevention of vitamin D    deficiency: an Endocrine Society clinical practice    guideline. JCEM. 2011 Jul; 96(7):1911-30.   TSH + free T4     Status: Abnormal   Collection Time: 04/06/16 12:00 AM  Result Value Ref Range   TSH 4.550 (H) 0.450 - 4.500 uIU/mL   Free T4 1.18 0.82 - 1.77 ng/dL  Lipid Panel w/o Chol/HDL Ratio     Status: Abnormal   Collection Time: 04/06/16 12:00 AM  Result Value Ref Range   Cholesterol, Total 188 100 - 199 mg/dL   Triglycerides 171 (H) 0 - 149 mg/dL   HDL 55 >39 mg/dL   VLDL Cholesterol Cal 34 5 - 40 mg/dL   LDL Calculated 99 0 - 99 mg/dL  Specimen status report     Status: None   Collection Time: 04/06/16 12:00 AM  Result Value Ref Range   specimen status report Comment     Comment: Written Authorization Written Authorization Written Authorization Received. Authorization received from [place] 04-08-2016 Logged by Milana Obey       PHQ2/9: Depression screen [facility] 2/9 04/15/2016 10/23/2015 10/21/2014  Decreased Interest 0 0 0  Down, Depressed, Hopeless 0 0 0  PHQ - 2 Score 0 0 0     Fall Risk: Fall Risk  04/15/2016 10/23/2015 10/21/2014  Falls in the past year? No No No     Functional Status Survey: Is the patient deaf or have difficulty hearing?: No Does the patient have difficulty seeing, even when wearing glasses/contacts?: Yes Does the patient have difficulty concentrating, remembering, or making decisions?: No Does the patient have difficulty walking or climbing stairs?: No Does the patient have difficulty dressing or bathing?: No Does the patient have difficulty doing errands alone such as visiting a doctor's office or shopping?: No    Assessment & Plan  1. History of Hashimoto thyroiditis  Doing well right now, denies hypothyroidism symptoms   2.  Hypertriglyceridemia  - omega-3 acid ethyl esters (LOVAZA) 1 g capsule; Take 2 capsules (2 g total) by mouth 2 (two) times daily.  Dispense: 120 capsule; Refill: 5  3. Subclinical hypothyroidism   4. Encounter for surveillance of contraceptive pills  - SPRINTEC 28 0.25-35 MG-MCG tablet; Take 1 tablet by mouth daily.  Dispense: 3 Package; Refill: 4  5. Keratosis pilaris  - pimecrolimus (ELIDEL) 1 % cream; Apply topically 2 (two) times daily.  Dispense: 100 g; Refill: 2  6. Dermatitis of face  - pimecrolimus (ELIDEL) 1 % cream; Apply topically 2 (two) times daily.  Dispense: 100 g; Refill: 2

## 2016-04-18 ENCOUNTER — Encounter: Payer: Self-pay | Admitting: *Deleted

## 2016-04-19 ENCOUNTER — Ambulatory Visit: Payer: BLUE CROSS/BLUE SHIELD | Admitting: Anesthesiology

## 2016-04-19 ENCOUNTER — Ambulatory Visit
Admission: RE | Admit: 2016-04-19 | Discharge: 2016-04-19 | Disposition: A | Discharge status: Home / Self Care | Payer: BLUE CROSS/BLUE SHIELD | Source: Ambulatory Visit | Attending: Gastroenterology | Admitting: Gastroenterology

## 2016-04-19 ENCOUNTER — Encounter
Admission: RE | Disposition: A | Discharge status: Home / Self Care | Payer: Self-pay | Source: Ambulatory Visit | Attending: Gastroenterology

## 2016-04-19 DIAGNOSIS — Z7982 Long term (current) use of aspirin: Secondary | ICD-10-CM | POA: Insufficient documentation

## 2016-04-19 DIAGNOSIS — E785 Hyperlipidemia, unspecified: Secondary | ICD-10-CM | POA: Insufficient documentation

## 2016-04-19 DIAGNOSIS — Z79899 Other long term (current) drug therapy: Secondary | ICD-10-CM | POA: Insufficient documentation

## 2016-04-19 DIAGNOSIS — Z1211 Encounter for screening for malignant neoplasm of colon: Secondary | ICD-10-CM | POA: Diagnosis present

## 2016-04-19 DIAGNOSIS — E559 Vitamin D deficiency, unspecified: Secondary | ICD-10-CM | POA: Diagnosis not present

## 2016-04-19 DIAGNOSIS — D123 Benign neoplasm of transverse colon: Secondary | ICD-10-CM | POA: Diagnosis not present

## 2016-04-19 DIAGNOSIS — E039 Hypothyroidism, unspecified: Secondary | ICD-10-CM | POA: Diagnosis not present

## 2016-04-19 DIAGNOSIS — L858 Other specified epidermal thickening: Secondary | ICD-10-CM | POA: Insufficient documentation

## 2016-04-19 HISTORY — PX: COLONOSCOPY WITH PROPOFOL: SHX5780

## 2016-04-19 LAB — POCT PREGNANCY, URINE: PREG TEST UR: NEGATIVE

## 2016-04-19 LAB — HM COLONOSCOPY

## 2016-04-19 SURGERY — COLONOSCOPY WITH PROPOFOL
Anesthesia: General

## 2016-04-19 MED ORDER — PROPOFOL 500 MG/50ML IV EMUL
INTRAVENOUS | Status: DC | PRN
  Administered 2016-04-19: 160 ug/kg/min via INTRAVENOUS

## 2016-04-19 MED ORDER — LIDOCAINE 2% (20 MG/ML) 5 ML SYRINGE
INTRAMUSCULAR | Status: DC | PRN
  Administered 2016-04-19: 30 mg via INTRAVENOUS

## 2016-04-19 MED ORDER — PHENYLEPHRINE HCL 10 MG/ML IJ SOLN
INTRAMUSCULAR | Status: DC | PRN
  Administered 2016-04-19: 100 ug via INTRAVENOUS

## 2016-04-19 MED ORDER — SODIUM CHLORIDE 0.9 % IV SOLN
INTRAVENOUS | Status: DC
  Administered 2016-04-19: 10:00:00 via INTRAVENOUS

## 2016-04-19 MED ORDER — MIDAZOLAM HCL 5 MG/5ML IJ SOLN
INTRAMUSCULAR | Status: DC | PRN
  Administered 2016-04-19: 1 mg via INTRAVENOUS

## 2016-04-19 MED ORDER — GLYCOPYRROLATE 0.2 MG/ML IJ SOLN
INTRAMUSCULAR | Status: DC | PRN
  Administered 2016-04-19: 0.2 mg via INTRAVENOUS

## 2016-04-19 MED ORDER — FENTANYL CITRATE (PF) 100 MCG/2ML IJ SOLN
INTRAMUSCULAR | Status: DC | PRN
  Administered 2016-04-19: 50 ug via INTRAVENOUS

## 2016-04-19 MED ORDER — ATROPINE SULFATE 0.4 MG/ML IJ SOLN
INTRAMUSCULAR | Status: DC | PRN
  Administered 2016-04-19: 0.2 mg via INTRAVENOUS

## 2016-04-19 MED ORDER — PROPOFOL 10 MG/ML IV BOLUS
INTRAVENOUS | Status: DC | PRN
  Administered 2016-04-19: 100 mg via INTRAVENOUS

## 2016-04-19 MED ORDER — SODIUM CHLORIDE 0.9 % IV SOLN: INTRAVENOUS | Status: DC

## 2016-04-19 MED ORDER — EPHEDRINE SULFATE 50 MG/ML IJ SOLN
INTRAMUSCULAR | Status: DC | PRN
  Administered 2016-04-19: 10 mg via INTRAVENOUS

## 2016-04-19 NOTE — Anesthesia Preprocedure Evaluation (Signed)
Anesthesia Evaluation  Patient identified by MRN, date of birth, ID band Patient awake    Reviewed: Allergy & Precautions, NPO status , Patient's Chart, lab work & pertinent test results  History of Anesthesia Complications Negative for: history of anesthetic complications  Airway Mallampati: II       Dental   Pulmonary neg pulmonary ROS,           Cardiovascular negative cardio ROS       Neuro/Psych negative neurological ROS     GI/Hepatic negative GI ROS, Neg liver ROS,   Endo/Other  Hypothyroidism (history, no meds now)   Renal/GU negative Renal ROS     Musculoskeletal   Abdominal   Peds  Hematology negative hematology ROS (+)   Anesthesia Other Findings   Reproductive/Obstetrics                             Anesthesia Physical Anesthesia Plan  ASA: II  Anesthesia Plan: General   Post-op Pain Management:    Induction:   Airway Management Planned: Nasal Cannula  Additional Equipment:   Intra-op Plan:   Post-operative Plan:   Informed Consent: I have reviewed the patients History and Physical, chart, labs and discussed the procedure including the risks, benefits and alternatives for the proposed anesthesia with the patient or authorized representative who has indicated his/her understanding and acceptance.     Plan Discussed with:   Anesthesia Plan Comments:         Anesthesia Quick Evaluation

## 2016-04-19 NOTE — Op Note (Signed)
[facility] Gastroenterology Patient Name: Autumn Long Procedure Date: 04/19/2016 10:39 AM MRN: [id number] Account #: [id number] Date of Birth: 1966-04-07 Admit Type: Outpatient Age: 50 Room: [facility] ENDO ROOM 3 Gender: Female Note Status: Finalized Procedure:            Colonoscopy Indications:          Screening for colorectal malignant neoplasm, This is                        the patient's first colonoscopy Providers:            Lollie Sails, MD Referring MD:         Bethena Roys. Sowles, MD (Referring MD) Medicines:            Monitored Anesthesia Care Complications:        No immediate complications. Procedure:            Pre-Anesthesia Assessment:                       - ASA Grade Assessment: II - A patient with mild                        systemic disease.                       After obtaining informed consent, the colonoscope was                        passed under direct vision. Throughout the procedure,                        the patient's blood pressure, pulse, and oxygen                        saturations were monitored continuously. The                        Colonoscope was introduced through the anus and                        advanced to the the cecum, identified by appendiceal                        orifice and ileocecal valve. The patient tolerated the                        procedure well. The quality of the bowel preparation                        was good. Findings:      A 2 mm polyp was found in the hepatic flexure. The polyp was flat. The       polyp was removed with a cold biopsy forceps. Resection and retrieval       were complete.      The retroflexed view of the distal rectum and anal verge was normal and       showed no anal or rectal abnormalities.      The digital rectal exam was normal.      The exam was otherwise without abnormality. Impression:           - One  2 mm polyp at the hepatic flexure, removed with a                cold biopsy forceps. Resected and retrieved.                       - The distal rectum and anal verge are normal on                        retroflexion view.                       - The examination was otherwise normal. Recommendation:       - Discharge patient to home.                       - Await pathology results.                       - Telephone GI clinic for pathology results in 1 week. Procedure Code(s):    --- Professional ---                       [id number], Colonoscopy, flexible; with biopsy, single or                        multiple Diagnosis Code(s):    --- Professional ---                       Z12.11, Encounter for screening for malignant neoplasm                        of colon                       D12.3, Benign neoplasm of transverse colon (hepatic                        flexure or splenic flexure) CPT copyright 2016 American Medical Association. All rights reserved. The codes documented in this report are preliminary and upon coder review may  be revised to meet current compliance requirements. Lollie Sails, MD 04/19/2016 11:04:40 AM This report has been signed electronically. Number of Addenda: 0 Note Initiated On: 04/19/2016 10:39 AM Scope Withdrawal Time: 0 hours 5 minutes 51 seconds  Total Procedure Duration: 0 hours 14 minutes 19 seconds       [facility]

## 2016-04-19 NOTE — H&P (Signed)
Outpatient short stay form Pre-procedure 04/19/2016 10:37 AM Autumn Sails MD  Primary Physician: Dr. Steele Sizer  Reason for visit:  Colonoscopy  History of present illness:  Patient is a 50 year old female presenting today as above. She tolerated her prep well. She takes no blood thinning agents. She does take 81 mg aspirin that was held. She takes no other aspirin products.    Current Facility-Administered Medications:  .  0.9 %  sodium chloride infusion, , Intravenous, Continuous, Autumn Sails, MD, Last Rate: 20 mL/hr at 04/19/16 1018 .  0.9 %  sodium chloride infusion, , Intravenous, Continuous, Autumn Sails, MD  Prescriptions Prior to Admission  Medication Sig Dispense Refill Last Dose  . aspirin EC 81 MG tablet Take 1 tablet (81 mg total) by mouth daily. 30 tablet 0 Past Week at Unknown time  . cholecalciferol (VITAMIN D) 1000 UNITS tablet Take 2,000 Units by mouth daily.   Past Week at Unknown time  . omega-3 acid ethyl esters (LOVAZA) 1 g capsule Take 2 capsules (2 g total) by mouth 2 (two) times daily. 120 capsule 5 Past Week at Unknown time  . SPRINTEC 28 0.25-35 MG-MCG tablet Take 1 tablet by mouth daily. 3 Package 4 04/18/2016 at 0600  . pimecrolimus (ELIDEL) 1 % cream Apply topically 2 (two) times daily. 100 g 2      No Known Allergies   Past Medical History:  Diagnosis Date  . Hematuria   . Hyperlipidemia   . Hypothyroidism   . Keratosis pilaris   . Vitamin D deficiency     Review of systems:      Physical Exam    Heart and lungs: Regular rate and rhythm without rub or gallop lungs are bilaterally clear.    HEENT: Normocephalic atraumatic eyes are anicteric    Other:     Pertinant exam for procedure: Soft nontender nondistended bowel sounds positive normoactive.    Planned proceedures: Colonoscopy and indicated procedures. I have discussed the risks benefits and complications of procedures to include not limited to bleeding,  infection, perforation and the risk of sedation and the patient wishes to proceed.    Autumn Sails, MD Gastroenterology 04/19/2016  10:37 AM

## 2016-04-19 NOTE — Anesthesia Postprocedure Evaluation (Signed)
Anesthesia Post Note  Patient: Autumn Long  Procedure(s) Performed: Procedure(s) (LRB): COLONOSCOPY WITH PROPOFOL (N/A)  Patient location during evaluation: Endoscopy Anesthesia Type: General Level of consciousness: awake and alert Pain management: pain level controlled Vital Signs Assessment: post-procedure vital signs reviewed and stable Respiratory status: spontaneous breathing and respiratory function stable Cardiovascular status: stable Anesthetic complications: no     Last Vitals:  Vitals:   04/19/16 1107 04/19/16 1108  BP: (!) 113/56 (!) 113/56  Pulse: 80 78  Resp: 17 16  Temp: 36.6 C 36.6 C    Last Pain:  Vitals:   04/19/16 1107  TempSrc: Tympanic                 KEPHART,WILLIAM K

## 2016-04-19 NOTE — Transfer of Care (Signed)
Immediate Anesthesia Transfer of Care Note  Patient: Autumn Long  Procedure(s) Performed: Procedure(s): COLONOSCOPY WITH PROPOFOL (N/A)  Patient Location: PACU and Endoscopy Unit  Anesthesia Type:General  Level of Consciousness: awake, oriented and patient cooperative  Airway & Oxygen Therapy: Patient Spontanous Breathing and Patient connected to nasal cannula oxygen  Post-op Assessment: Report given to RN and Post -op Vital signs reviewed and stable  Post vital signs: Reviewed and stable  Last Vitals:  Vitals:   04/19/16 1009  BP: 133/81  Pulse: 62  Resp: 17  Temp: 36.9 C    Last Pain:  Vitals:   04/19/16 1009  TempSrc: Tympanic         Complications: No apparent anesthesia complications

## 2016-04-20 ENCOUNTER — Encounter: Payer: Self-pay | Admitting: Gastroenterology

## 2016-04-20 LAB — SURGICAL PATHOLOGY

## 2016-05-16 ENCOUNTER — Encounter: Payer: Self-pay | Admitting: Family Medicine

## 2016-10-12 ENCOUNTER — Telehealth: Payer: Self-pay | Admitting: Family Medicine

## 2016-10-12 NOTE — Telephone Encounter (Signed)
Pt has upcoming physical. She is requesting a lab order for standard lab testing for a physical. Please contact patient once this is ready

## 2016-10-14 ENCOUNTER — Other Ambulatory Visit: Payer: Self-pay | Admitting: Family Medicine

## 2016-10-14 DIAGNOSIS — Z01419 Encounter for gynecological examination (general) (routine) without abnormal findings: Secondary | ICD-10-CM

## 2016-10-14 NOTE — Telephone Encounter (Signed)
Ordered labs

## 2016-10-19 LAB — LIPID PANEL
CHOLESTEROL: 190 (ref 0–200)
HDL: 56 (ref 35–70)
LDL Cholesterol: 88
Triglycerides: 230 — AB (ref 40–160)

## 2016-10-19 LAB — BASIC METABOLIC PANEL
BUN: 12 (ref 4–21)
CREATININE: 0.7 (ref 0.5–1.1)
GLUCOSE: 89

## 2016-10-19 LAB — CBC AND DIFFERENTIAL
PLATELETS: 212 (ref 150–399)
WBC: 6.7

## 2016-10-19 LAB — VITAMIN D 25 HYDROXY (VIT D DEFICIENCY, FRACTURES): Vit D, 25-Hydroxy: 44.3

## 2016-10-19 LAB — TSH: TSH: 5.68 (ref 0.41–5.90)

## 2016-10-19 LAB — HEMOGLOBIN A1C: Hemoglobin A1C: 5.5

## 2016-10-21 ENCOUNTER — Encounter: Payer: Self-pay | Admitting: Family Medicine

## 2016-10-25 ENCOUNTER — Encounter: Payer: BLUE CROSS/BLUE SHIELD | Admitting: Family Medicine

## 2016-11-01 ENCOUNTER — Ambulatory Visit (INDEPENDENT_AMBULATORY_CARE_PROVIDER_SITE_OTHER): Payer: BLUE CROSS/BLUE SHIELD | Admitting: Family Medicine

## 2016-11-01 ENCOUNTER — Encounter: Payer: Self-pay | Admitting: Family Medicine

## 2016-11-01 VITALS — BP 116/69 | HR 69 | Temp 98.5°F | Resp 16 | Ht 65.0 in | Wt 154.0 lb

## 2016-11-01 DIAGNOSIS — E781 Pure hyperglyceridemia: Secondary | ICD-10-CM

## 2016-11-01 DIAGNOSIS — Z8639 Personal history of other endocrine, nutritional and metabolic disease: Secondary | ICD-10-CM | POA: Diagnosis not present

## 2016-11-01 DIAGNOSIS — L858 Other specified epidermal thickening: Secondary | ICD-10-CM

## 2016-11-01 DIAGNOSIS — Z01419 Encounter for gynecological examination (general) (routine) without abnormal findings: Secondary | ICD-10-CM

## 2016-11-01 DIAGNOSIS — Z124 Encounter for screening for malignant neoplasm of cervix: Secondary | ICD-10-CM

## 2016-11-01 DIAGNOSIS — E039 Hypothyroidism, unspecified: Secondary | ICD-10-CM

## 2016-11-01 DIAGNOSIS — Z0000 Encounter for general adult medical examination without abnormal findings: Secondary | ICD-10-CM

## 2016-11-01 DIAGNOSIS — Z3041 Encounter for surveillance of contraceptive pills: Secondary | ICD-10-CM

## 2016-11-01 DIAGNOSIS — E038 Other specified hypothyroidism: Secondary | ICD-10-CM

## 2016-11-01 MED ORDER — SPRINTEC 28 0.25-35 MG-MCG PO TABS: 1.0000 | ORAL_TABLET | Freq: Every day | ORAL | 4 refills | Status: DC

## 2016-11-01 MED ORDER — OMEGA-3-ACID ETHYL ESTERS 1 G PO CAPS: 2.0000 g | ORAL_CAPSULE | Freq: Two times a day (BID) | ORAL | 5 refills | Status: DC

## 2016-11-01 NOTE — Patient Instructions (Signed)
Preventive Care 40-64 Years, Female Preventive care refers to lifestyle choices and visits with your health care provider that can promote health and wellness. What does preventive care include?  A yearly physical exam. This is also called an annual well check.  Dental exams once or twice a year.  Routine eye exams. Ask your health care provider how often you should have your eyes checked.  Personal lifestyle choices, including: ? Daily care of your teeth and gums. ? Regular physical activity. ? Eating a healthy diet. ? Avoiding tobacco and drug use. ? Limiting alcohol use. ? Practicing safe sex. ? Taking low-dose aspirin daily starting at age 58. ? Taking vitamin and mineral supplements as recommended by your health care provider. What happens during an annual well check? The services and screenings done by your health care provider during your annual well check will depend on your age, overall health, lifestyle risk factors, and family history of disease. Counseling Your health care provider may ask you questions about your:  Alcohol use.  Tobacco use.  Drug use.  Emotional well-being.  Home and relationship well-being.  Sexual activity.  Eating habits.  Work and work Statistician.  Method of birth control.  Menstrual cycle.  Pregnancy history.  Screening You may have the following tests or measurements:  Height, weight, and BMI.  Blood pressure.  Lipid and cholesterol levels. These may be checked every 5 years, or more frequently if you are over 81 years old.  Skin check.  Lung cancer screening. You may have this screening every year starting at age 78 if you have a 30-pack-year history of smoking and currently smoke or have quit within the past 15 years.  Fecal occult blood test (FOBT) of the stool. You may have this test every year starting at age 65.  Flexible sigmoidoscopy or colonoscopy. You may have a sigmoidoscopy every 5 years or a colonoscopy  every 10 years starting at age 30.  Hepatitis C blood test.  Hepatitis B blood test.  Sexually transmitted disease (STD) testing.  Diabetes screening. This is done by checking your blood sugar (glucose) after you have not eaten for a while (fasting). You may have this done every 1-3 years.  Mammogram. This may be done every 1-2 years. Talk to your health care provider about when you should start having regular mammograms. This may depend on whether you have a family history of breast cancer.  BRCA-related cancer screening. This may be done if you have a family history of breast, ovarian, tubal, or peritoneal cancers.  Pelvic exam and Pap test. This may be done every 3 years starting at age 80. Starting at age 36, this may be done every 5 years if you have a Pap test in combination with an HPV test.  Bone density scan. This is done to screen for osteoporosis. You may have this scan if you are at high risk for osteoporosis.  Discuss your test results, treatment options, and if necessary, the need for more tests with your health care provider. Vaccines Your health care provider may recommend certain vaccines, such as:  Influenza vaccine. This is recommended every year.  Tetanus, diphtheria, and acellular pertussis (Tdap, Td) vaccine. You may need a Td booster every 10 years.  Varicella vaccine. You may need this if you have not been vaccinated.  Zoster vaccine. You may need this after age 5.  Measles, mumps, and rubella (MMR) vaccine. You may need at least one dose of MMR if you were born in  1957 or later. You may also need a second dose.  Pneumococcal 13-valent conjugate (PCV13) vaccine. You may need this if you have certain conditions and were not previously vaccinated.  Pneumococcal polysaccharide (PPSV23) vaccine. You may need one or two doses if you smoke cigarettes or if you have certain conditions.  Meningococcal vaccine. You may need this if you have certain  conditions.  Hepatitis A vaccine. You may need this if you have certain conditions or if you travel or work in places where you may be exposed to hepatitis A.  Hepatitis B vaccine. You may need this if you have certain conditions or if you travel or work in places where you may be exposed to hepatitis B.  Haemophilus influenzae type b (Hib) vaccine. You may need this if you have certain conditions.  Talk to your health care provider about which screenings and vaccines you need and how often you need them. This information is not intended to replace advice given to you by your health care provider. Make sure you discuss any questions you have with your health care provider. Document Released: 05/15/2015 Document Revised: 01/06/2016 Document Reviewed: 02/17/2015 Elsevier Interactive Patient Education  2017 [employer].

## 2016-11-01 NOTE — Progress Notes (Signed)
Name: Autumn Long   MRN: [id number]    DOB: [birth year]   Date:11/01/2016       Progress Note  Subjective  Chief Complaint  Chief Complaint  Patient presents with  . Annual Exam    HPI  Well Woman: sexually active, married, no vaginal discharge, due for cervical screening, mammogram is up to date and colonoscopy is up to date. Discussed aspirin 81 mg daily , she still on ocp's, discussed stop taking it prior to next visit, to see if she is already menopausal   Subclinical hypothyroidism: she was diagnosed with Hashimoto's thyroiditis years ago , last TSH up,  still slightly elevated, but not symptoms of hypothyroidism. Denies constipation, dry skin resolved,  no fatigue and weight is stable.   Hypertriglyceridemia: she started on Lovaza June 2017, triglycerides is much better, only taking 1 g twice daily so we will increase to up to 2 g twice daily, but she is only taking once day, triglycerides still up, she will take it as prescribed  Contact dermatitis face: doing well, using prn medication, arm rash also improved  Vitamin D deficiency: continue otc supplementation but try taking 2000 units daily    Patient Active Problem List   Diagnosis Date Noted  . Dyslipidemia 10/18/2014  . Keratosis pilaris 10/18/2014  . Subclinical hypothyroidism 10/18/2014  . Vitamin D deficiency 10/18/2014  . History of Hashimoto thyroiditis 03/18/2013    Past Surgical History:  Procedure Laterality Date  . CESAREAN SECTION  1991  . CESAREAN SECTION  1992  . COLONOSCOPY WITH PROPOFOL N/A 04/19/2016   Procedure: COLONOSCOPY WITH PROPOFOL;  Surgeon: Lollie Sails, MD;  Location: [facility] ENDOSCOPY;  Service: Endoscopy;  Laterality: N/A;    Family History  Problem Relation Age of Onset  . Heart disease Father   . Cancer Maternal Grandmother        Stomach    Social History   Social History  . Marital status: Married    Spouse name: N/A  . Number of children: N/A  . Years of  education: N/A   Occupational History  . Not on file.   Social History Main Topics  . Smoking status: Never Smoker  . Smokeless tobacco: Never Used  . Alcohol use 0.0 oz/week     Comment: occasionally  . Drug use: No  . Sexual activity: Yes    Partners: Male   Other Topics Concern  . Not on file   Social History Narrative  . No narrative on file     Current Outpatient Prescriptions:  .  aspirin EC 81 MG tablet, Take 1 tablet (81 mg total) by mouth daily., Disp: 30 tablet, Rfl: 0 .  cholecalciferol (VITAMIN D) 1000 UNITS tablet, Take 2,000 Units by mouth daily., Disp: , Rfl:  .  omega-3 acid ethyl esters (LOVAZA) 1 g capsule, Take 2 capsules (2 g total) by mouth 2 (two) times daily., Disp: 120 capsule, Rfl: 5 .  pimecrolimus (ELIDEL) 1 % cream, Apply topically 2 (two) times daily., Disp: 100 g, Rfl: 2 .  SPRINTEC 28 0.25-35 MG-MCG tablet, Take 1 tablet by mouth daily., Disp: 3 Package, Rfl: 4  No Known Allergies   ROS  Constitutional: Negative for fever or weight change.  Respiratory: Negative for cough and shortness of breath.   Cardiovascular: Negative for chest pain or palpitations.  Gastrointestinal: Negative for abdominal pain, no bowel changes.  Musculoskeletal: Negative for gait problem or joint swelling.  Skin: Negative for rash.  Neurological: Negative for dizziness  or headache.  No other specific complaints in a complete review of systems (except as listed in HPI above).  Objective  Vitals:   11/01/16 0941  BP: 116/69  Pulse: 69  Resp: 16  Temp: 98.5 F (36.9 C)  TempSrc: Oral  SpO2: 98%  Weight: 154 lb (69.9 kg)  Height: 5\' 5"  (1.651 m)    Body mass index is 25.63 kg/m.  Physical Exam  Constitutional: Patient appears well-developed and well-nourished. No distress.  HENT: Head: Normocephalic and atraumatic. Ears: B TMs ok, no erythema or effusion; Nose: Nose normal. Mouth/Throat: Oropharynx is clear and moist. No oropharyngeal exudate.  Eyes:  Conjunctivae and EOM are normal. Pupils are equal, round, and reactive to light. No scleral icterus.  Neck: Normal range of motion. Neck supple. No JVD present. No thyromegaly present.  Cardiovascular: Normal rate, regular rhythm and normal heart sounds.  No murmur heard. No BLE edema. Pulmonary/Chest: Effort normal and breath sounds normal. No respiratory distress. Abdominal: Soft. Bowel sounds are normal, no distension. There is no tenderness. no masses Breast: no lumps or masses, no nipple discharge or rashes FEMALE GENITALIA:  External genitalia normal External urethra normal Vaginal vault normal without discharge or lesions Cervix normal without discharge or lesions Bimanual exam normal without masses RECTAL: not done Musculoskeletal: Normal range of motion, no joint effusions. No gross deformities Neurological: he is alert and oriented to person, place, and time. No cranial nerve deficit. Coordination, balance, strength, speech and gait are normal.  Skin: Skin is warm and dry. No rash noted. No erythema.  Psychiatric: Patient has a normal mood and affect. behavior is normal. Judgment and thought content normal.  Recent Results (from the past 2160 hour(s))  CBC and differential     Status: None   Collection Time: 10/19/16 12:00 AM  Result Value Ref Range   Platelets 212 150 - 399   WBC 6.7   VITAMIN D 25 Hydroxy (Vit-D Deficiency, Fractures)     Status: None   Collection Time: 10/19/16 12:00 AM  Result Value Ref Range   Vit D, 25-Hydroxy 67.6   Basic metabolic panel     Status: None   Collection Time: 10/19/16 12:00 AM  Result Value Ref Range   Glucose 89    BUN 12 4 - 21   Creatinine 0.7 0.5 - 1.1  Lipid panel     Status: Abnormal   Collection Time: 10/19/16 12:00 AM  Result Value Ref Range   Triglycerides 230 (A) 40 - 160   Cholesterol 190 0 - 200   HDL 56 35 - 70   LDL Cholesterol 88   Hemoglobin A1c     Status: None   Collection Time: 10/19/16 12:00 AM  Result  Value Ref Range   Hemoglobin A1C 5.5   TSH     Status: None   Collection Time: 10/19/16 12:00 AM  Result Value Ref Range   TSH 5.68 0.41 - 5.90     PHQ2/9: Depression screen [facility] 2/9 11/01/2016 04/15/2016 10/23/2015 10/21/2014  Decreased Interest 0 0 0 0  Down, Depressed, Hopeless 0 0 0 0  PHQ - 2 Score 0 0 0 0     Fall Risk: Fall Risk  11/01/2016 04/15/2016 10/23/2015 10/21/2014  Falls in the past year? No No No No    Functional Status Survey: Is the patient deaf or have difficulty hearing?: No Does the patient have difficulty seeing, even when wearing glasses/contacts?: No Does the patient have difficulty concentrating, remembering, or making decisions?:  No Does the patient have difficulty walking or climbing stairs?: No Does the patient have difficulty dressing or bathing?: No Does the patient have difficulty doing errands alone such as visiting a doctor's office or shopping?: No    Assessment & Plan  1. Well woman exam  Discussed importance of 150 minutes of physical activity weekly, eat two servings of fish weekly, eat one serving of tree nuts ( cashews, pistachios, pecans, almonds.Marland Kitchen) every other day, eat 6 servings of fruit/vegetables daily and drink plenty of water and avoid sweet beverages.   2. Hypertriglyceridemia  - omega-3 acid ethyl esters (LOVAZA) 1 g capsule; Take 2 capsules (2 g total) by mouth 2 (two) times daily.  Dispense: 120 capsule; Refill: 5  3. Subclinical hypothyroidism  - Thyroid Panel With TSH  4. Keratosis pilaris  Doing well   5. History of Hashimoto thyroiditis   6. Screening for cervical cancer  - Pap IG and HPV (high risk) DNA detection  7. Encounter for surveillance of contraceptive pills  - SPRINTEC 28 0.25-35 MG-MCG tablet; Take 1 tablet by mouth daily.  Dispense: 3 Package; Refill: 4

## 2016-11-03 LAB — PAP IG AND HPV HIGH-RISK
HPV, HIGH-RISK: NEGATIVE
PAP SMEAR COMMENT: 0

## 2017-02-17 LAB — THYROID PANEL WITH TSH
FREE THYROXINE INDEX: 1.9 (ref 1.2–4.9)
T3 Uptake Ratio: 24 % (ref 24–39)
T4 TOTAL: 7.9 ug/dL (ref 4.5–12.0)
TSH: 4.29 u[IU]/mL (ref 0.450–4.500)

## 2017-03-28 ENCOUNTER — Other Ambulatory Visit: Payer: Self-pay

## 2017-03-28 ENCOUNTER — Other Ambulatory Visit: Payer: Self-pay | Admitting: Family Medicine

## 2017-03-28 DIAGNOSIS — L309 Dermatitis, unspecified: Secondary | ICD-10-CM

## 2017-03-28 DIAGNOSIS — L858 Other specified epidermal thickening: Secondary | ICD-10-CM

## 2017-03-28 MED ORDER — PIMECROLIMUS 1 % EX CREA: TOPICAL_CREAM | Freq: Two times a day (BID) | CUTANEOUS | 0 refills | Status: DC

## 2017-03-28 NOTE — Telephone Encounter (Signed)
Elidel is denied by [employer]. This is medication is covered when generic tacrolimus ointment did not work or is not tolerated.

## 2017-10-10 ENCOUNTER — Ambulatory Visit (INDEPENDENT_AMBULATORY_CARE_PROVIDER_SITE_OTHER): Payer: BLUE CROSS/BLUE SHIELD | Admitting: Family Medicine

## 2017-10-10 ENCOUNTER — Encounter: Payer: Self-pay | Admitting: Family Medicine

## 2017-10-10 VITALS — BP 110/60 | HR 68 | Temp 98.3°F | Resp 16 | Ht 65.0 in | Wt 152.2 lb

## 2017-10-10 DIAGNOSIS — E781 Pure hyperglyceridemia: Secondary | ICD-10-CM

## 2017-10-10 DIAGNOSIS — R7303 Prediabetes: Secondary | ICD-10-CM

## 2017-10-10 DIAGNOSIS — R21 Rash and other nonspecific skin eruption: Secondary | ICD-10-CM | POA: Diagnosis not present

## 2017-10-10 DIAGNOSIS — Z8639 Personal history of other endocrine, nutritional and metabolic disease: Secondary | ICD-10-CM

## 2017-10-10 DIAGNOSIS — E038 Other specified hypothyroidism: Secondary | ICD-10-CM

## 2017-10-10 DIAGNOSIS — Z130 Encounter for screening for diseases of the blood and blood-forming organs and certain disorders involving the immune mechanism: Secondary | ICD-10-CM | POA: Diagnosis not present

## 2017-10-10 DIAGNOSIS — E039 Hypothyroidism, unspecified: Secondary | ICD-10-CM

## 2017-10-10 DIAGNOSIS — J0100 Acute maxillary sinusitis, unspecified: Secondary | ICD-10-CM

## 2017-10-10 DIAGNOSIS — Z3041 Encounter for surveillance of contraceptive pills: Secondary | ICD-10-CM

## 2017-10-10 DIAGNOSIS — E559 Vitamin D deficiency, unspecified: Secondary | ICD-10-CM

## 2017-10-10 DIAGNOSIS — L858 Other specified epidermal thickening: Secondary | ICD-10-CM

## 2017-10-10 MED ORDER — AZITHROMYCIN 500 MG PO TABS: 500.0000 mg | ORAL_TABLET | Freq: Every day | ORAL | 0 refills | Status: DC

## 2017-10-10 MED ORDER — SPRINTEC 28 0.25-35 MG-MCG PO TABS: 1.0000 | ORAL_TABLET | Freq: Every day | ORAL | 4 refills | Status: DC

## 2017-10-10 MED ORDER — KETOCONAZOLE 2 % EX CREA: 1.0000 "application " | TOPICAL_CREAM | Freq: Two times a day (BID) | CUTANEOUS | 0 refills | Status: DC

## 2017-10-10 NOTE — Progress Notes (Signed)
Name: Autumn Long   MRN: [id number]    DOB: [birth year]   Date:10/10/2017       Progress Note  Subjective  Chief Complaint  Chief Complaint  Patient presents with  . Sinusitis    x 2 weeks that has not change. She has congestion and sinus pressure with mucous that is clear and yellow. Benadryl and Sudafed.  . Rash    r side of her face on her chin x 5 days with itching. She denies pain she has treated it with hydrocortisone cream and Benadryl    HPI  Sinus problem: she is usually pretty healthy, however had to go to Urgent care twice in April, once for a dry cough - CXR was negative and was not given antibiotics. She went back for nasal congestion and ear pain, diagnosed with serous otitis media and was given a round of prednisone and flonase. She states symptoms improved however over the past two weeks she has noticed daily nasal congestion, post-nasal drainage, and also noticing facial pressure, left worse than right. She has mild cough that is yellow from the post-nasal drainage. Also feels her head is under water. No fever, chills or headaches. She cannot taste food since she has been sick  High triglycerides: she is taking Lovaza and last labs was at goal , we will recheck labs   Keratosis pilaris: she states symptoms improved, not using Elidel lately  Rash on face : she noticed a itchy spot on right chin 5 days ago. She tried otc hydrocortisone without much help. She was exposed to ring worm by a student last week. She does not have any pets or children at home.   Subclinical hypothyroidism: no symptoms at this time, no tachycardia, feeling well, we will recheck labs.    Patient Active Problem List   Diagnosis Date Noted  . Dyslipidemia 10/18/2014  . Keratosis pilaris 10/18/2014  . Subclinical hypothyroidism 10/18/2014  . Vitamin D deficiency 10/18/2014  . History of Hashimoto thyroiditis 03/18/2013    Past Surgical History:  Procedure Laterality Date  . CESAREAN  SECTION  1991  . CESAREAN SECTION  1992  . COLONOSCOPY WITH PROPOFOL N/A 04/19/2016   Procedure: COLONOSCOPY WITH PROPOFOL;  Surgeon: Lollie Sails, MD;  Location: [facility] ENDOSCOPY;  Service: Endoscopy;  Laterality: N/A;    Family History  Problem Relation Age of Onset  . Heart disease Father   . Cancer Maternal Grandmother        Stomach    Social History   Socioeconomic History  . Marital status: Married    Spouse name: Not on file  . Number of children: Not on file  . Years of education: Not on file  . Highest education level: Not on file  Occupational History  . Not on file  Social Needs  . Financial resource strain: Not on file  . Food insecurity:    Worry: Not on file    Inability: Not on file  . Transportation needs:    Medical: Not on file    Non-medical: Not on file  Tobacco Use  . Smoking status: Never Smoker  . Smokeless tobacco: Never Used  Substance and Sexual Activity  . Alcohol use: Yes    Alcohol/week: 0.0 oz    Comment: occasionally  . Drug use: No  . Sexual activity: Yes    Partners: Male  Lifestyle  . Physical activity:    Days per week: Not on file    Minutes per session: Not on  file  . Stress: Not on file  Relationships  . Social connections:    Talks on phone: Not on file    Gets together: Not on file    Attends religious service: Not on file    Active member of club or organization: Not on file    Attends meetings of clubs or organizations: Not on file    Relationship status: Not on file  . Intimate partner violence:    Fear of current or ex partner: Not on file    Emotionally abused: Not on file    Physically abused: Not on file    Forced sexual activity: Not on file  Other Topics Concern  . Not on file  Social History Narrative  . Not on file     Current Outpatient Medications:  .  aspirin EC 81 MG tablet, Take 1 tablet (81 mg total) by mouth daily., Disp: 30 tablet, Rfl: 0 .  cholecalciferol (VITAMIN D) 1000 UNITS tablet,  Take 2,000 Units by mouth daily., Disp: , Rfl:  .  fluticasone (FLONASE) 50 MCG/ACT nasal spray, Place 2 sprays into the nose daily., Disp: , Rfl:  .  omega-3 acid ethyl esters (LOVAZA) 1 g capsule, Take 2 capsules (2 g total) by mouth 2 (two) times daily., Disp: 120 capsule, Rfl: 5 .  pimecrolimus (ELIDEL) 1 % cream, Apply topically 2 (two) times daily., Disp: 100 g, Rfl: 0 .  SPRINTEC 28 0.25-35 MG-MCG tablet, Take 1 tablet by mouth daily., Disp: 3 Package, Rfl: 4  No Known Allergies   ROS  Constitutional: Negative for fever or weight change.  Respiratory: positive  For mild  cough and shortness of breath.   Cardiovascular: Negative for chest pain or palpitations.  Gastrointestinal: Negative for abdominal pain, no bowel changes.  Musculoskeletal: Negative for gait problem or joint swelling.  Skin: Negative for rash.  Neurological: Negative for dizziness, positive for sinus pressure, but no  headache.  No other specific complaints in a complete review of systems (except as listed in HPI above).  Objective  Vitals:   10/10/17 0758  BP: 110/60  Pulse: 68  Resp: 16  Temp: 98.3 F (36.8 C)  TempSrc: Oral  SpO2: 98%  Weight: 152 lb 3.2 oz (69 kg)  Height: 5\' 5"  (1.651 m)    Body mass index is 25.33 kg/m.  Physical Exam  Constitutional: Patient appears well-developed and well-nourished.  No distress.  HEENT: head atraumatic, normocephalic, pupils equal and reactive to light, ears : normal TM bilaterally  neck supple, throat within normal limits, she has tenderness during percussion of left maxillary sinus.  Cardiovascular: Normal rate, regular rhythm and normal heart sounds.  No murmur heard. No BLE edema. Pulmonary/Chest: Effort normal and breath sounds normal. No respiratory distress. Abdominal: Soft.  There is no tenderness. Psychiatric: Patient has a normal mood and affect. behavior is normal. Judgment and thought content normal.  PHQ2/9: Depression screen [facility] 2/9  11/01/2016 04/15/2016 10/23/2015 10/21/2014  Decreased Interest 0 0 0 0  Down, Depressed, Hopeless 0 0 0 0  PHQ - 2 Score 0 0 0 0     Fall Risk: Fall Risk  10/10/2017 11/01/2016 04/15/2016 10/23/2015 10/21/2014  Falls in the past year? No No No No No     Assessment & Plan  1. Encounter for surveillance of contraceptive pills  - SPRINTEC 28 0.25-35 MG-MCG tablet; Take 1 tablet by mouth daily.  Dispense: 3 Package; Refill: 4  2. Rash of face  - ketoconazole (NIZORAL)  2 % cream; Apply 1 application topically 2 (two) times daily.  Dispense: 15 g; Refill: 0  3. Hypertriglyceridemia  - Lipid panel - Comprehensive metabolic panel  4. Subclinical hypothyroidism  - Thyroid Panel With TSH  5. Keratosis pilaris  Doing well   6. History of Hashimoto thyroiditis  - Thyroid Panel With TSH  7. Vitamin D deficiency  - VITAMIN D 25 Hydroxy (Vit-D Deficiency, Fractures)  8. Screening, deficiency anemia, iron  - CBC with Differential/Platelet  9. Pre-diabetes  - Hemoglobin A1c  1. Subacute maxillary sinusitis  We will try azithromycin 500 mg times 3 days, resume nasal spray and also take otc Zyrtec, call back if no improvement

## 2017-10-14 LAB — COMPREHENSIVE METABOLIC PANEL
A/G RATIO: 1.5 (ref 1.2–2.2)
ALT: 14 IU/L (ref 0–32)
AST: 16 IU/L (ref 0–40)
Albumin: 4.3 g/dL (ref 3.5–5.5)
Alkaline Phosphatase: 56 IU/L (ref 39–117)
BUN/Creatinine Ratio: 19 (ref 9–23)
BUN: 13 mg/dL (ref 6–24)
Bilirubin Total: 0.4 mg/dL (ref 0.0–1.2)
CALCIUM: 9.4 mg/dL (ref 8.7–10.2)
CO2: 21 mmol/L (ref 20–29)
CREATININE: 0.67 mg/dL (ref 0.57–1.00)
Chloride: 104 mmol/L (ref 96–106)
GFR, EST AFRICAN AMERICAN: 117 mL/min/{1.73_m2} (ref >59)
GFR, EST NON AFRICAN AMERICAN: 101 mL/min/{1.73_m2} (ref >59)
Globulin, Total: 2.9 g/dL (ref 1.5–4.5)
Glucose: 88 mg/dL (ref 65–99)
POTASSIUM: 5.3 mmol/L — AB (ref 3.5–5.2)
Sodium: 138 mmol/L (ref 134–144)
TOTAL PROTEIN: 7.2 g/dL (ref 6.0–8.5)

## 2017-10-14 LAB — CBC WITH DIFFERENTIAL/PLATELET
BASOS: 0 %
Basophils Absolute: 0 10*3/uL (ref 0.0–0.2)
EOS (ABSOLUTE): 0 10*3/uL (ref 0.0–0.4)
EOS: 0 %
HEMATOCRIT: 41.5 % (ref 34.0–46.6)
Hemoglobin: 13.7 g/dL (ref 11.1–15.9)
Immature Grans (Abs): 0 10*3/uL (ref 0.0–0.1)
Immature Granulocytes: 0 %
Lymphocytes Absolute: 2 10*3/uL (ref 0.7–3.1)
Lymphs: 29 %
MCH: 28.5 pg (ref 26.6–33.0)
MCHC: 33 g/dL (ref 31.5–35.7)
MCV: 87 fL (ref 79–97)
MONOS ABS: 0.4 10*3/uL (ref 0.1–0.9)
Monocytes: 6 %
Neutrophils Absolute: 4.6 10*3/uL (ref 1.4–7.0)
Neutrophils: 65 %
PLATELETS: 236 10*3/uL (ref 150–450)
RBC: 4.8 x10E6/uL (ref 3.77–5.28)
RDW: 13.7 % (ref 12.3–15.4)
WBC: 7.1 10*3/uL (ref 3.4–10.8)

## 2017-10-14 LAB — THYROID PANEL WITH TSH
FREE THYROXINE INDEX: 1.7 (ref 1.2–4.9)
T3 Uptake Ratio: 20 % — ABNORMAL LOW (ref 24–39)
T4, Total: 8.7 ug/dL (ref 4.5–12.0)
TSH: 4.02 u[IU]/mL (ref 0.450–4.500)

## 2017-10-14 LAB — LIPID PANEL
CHOL/HDL RATIO: 3.1 ratio (ref 0.0–4.4)
Cholesterol, Total: 174 mg/dL (ref 100–199)
HDL: 56 mg/dL (ref >39)
LDL CALC: 86 mg/dL (ref 0–99)
Triglycerides: 159 mg/dL — ABNORMAL HIGH (ref 0–149)
VLDL CHOLESTEROL CAL: 32 mg/dL (ref 5–40)

## 2017-10-14 LAB — HEMOGLOBIN A1C
Est. average glucose Bld gHb Est-mCnc: 108 mg/dL
Hgb A1c MFr Bld: 5.4 % (ref 4.8–5.6)

## 2017-10-14 LAB — VITAMIN D 25 HYDROXY (VIT D DEFICIENCY, FRACTURES): VIT D 25 HYDROXY: 36.8 ng/mL (ref 30.0–100.0)

## 2017-10-15 ENCOUNTER — Other Ambulatory Visit: Payer: Self-pay | Admitting: Family Medicine

## 2017-10-15 DIAGNOSIS — E781 Pure hyperglyceridemia: Secondary | ICD-10-CM

## 2017-10-20 LAB — HM MAMMOGRAPHY

## 2017-10-27 ENCOUNTER — Encounter: Payer: Self-pay | Admitting: Family Medicine

## 2017-10-27 ENCOUNTER — Telehealth: Payer: Self-pay | Admitting: Family Medicine

## 2017-10-27 NOTE — Telephone Encounter (Signed)
Copied from [place] [phone]. Topic: General - Other >> Oct 27, 2017 11:54 AM Yvette Rack wrote: Reason for CRM: pt calling for results of mammogram

## 2017-10-27 NOTE — Telephone Encounter (Signed)
Patient notified of her normal mammogram.

## 2017-10-27 NOTE — Telephone Encounter (Signed)
EXAM: MAMMO SCREENING BILATERAL DATE: 10/20/2017 8:14 AM ACCESSION: [id number] UN DICTATED: 10/26/2017 8:24 PM INTERPRETATION LOCATION: [place]  CLINICAL INDICATION: 52 years old Female: SCREENING MAMMOGRAM- Z92.89 - History of screening mammography  COMPARISON: 2018, 2017, 2016, 2015  TECHNIQUE: Bilateral full-field digital mammography, MLO and CC projections  BREAST DENSITY: b - There are scattered areas of fibroglandular density.  FINDINGS: There are no suspicious masses, malignant calcifications, sites of architectural distortion, or concerning asymmetries in either breast.  ASSESSMENT: BI-RADS Category: 1-Mammo1Yr : Negative.Mammogram due in 1 year.  Recommendation Laterality: Both  Annual routine screeningmammogram  Status   Please review

## 2017-10-27 NOTE — Telephone Encounter (Signed)
Normal mammogram

## 2017-11-03 ENCOUNTER — Ambulatory Visit (INDEPENDENT_AMBULATORY_CARE_PROVIDER_SITE_OTHER): Payer: BLUE CROSS/BLUE SHIELD | Admitting: Nurse Practitioner

## 2017-11-03 ENCOUNTER — Encounter: Payer: Self-pay | Admitting: Nurse Practitioner

## 2017-11-03 VITALS — BP 110/60 | HR 73 | Temp 97.8°F | Resp 16 | Ht 65.25 in | Wt 152.3 lb

## 2017-11-03 DIAGNOSIS — Z0000 Encounter for general adult medical examination without abnormal findings: Secondary | ICD-10-CM

## 2017-11-03 DIAGNOSIS — Z3041 Encounter for surveillance of contraceptive pills: Secondary | ICD-10-CM | POA: Diagnosis not present

## 2017-11-03 DIAGNOSIS — E781 Pure hyperglyceridemia: Secondary | ICD-10-CM

## 2017-11-03 MED ORDER — OMEGA-3-ACID ETHYL ESTERS 1 G PO CAPS: 2.0000 | ORAL_CAPSULE | Freq: Two times a day (BID) | ORAL | 11 refills | Status: DC

## 2017-11-03 MED ORDER — SPRINTEC 28 0.25-35 MG-MCG PO TABS: 1.0000 | ORAL_TABLET | Freq: Every day | ORAL | 4 refills | Status: DC

## 2017-11-03 NOTE — Patient Instructions (Signed)
-   Continue vitamin D 2000 IU a day - Can add Vitamin K 100 mcg at least twice a week

## 2017-11-03 NOTE — Progress Notes (Addendum)
Name: Autumn Long   MRN: [id number]    DOB: 1965/11/09   Date:11/03/2017       Progress Note  Subjective  Chief Complaint  Chief Complaint  Patient presents with  . Annual Exam    HPI  Patient presents for annual CPE.  Diet: three meals a day; coffee and toast; lunch- sandwich and salad; dinner- cook together. States 5-7 servings of fruits and vegetables of day. Drinks water- at least 5 glasses. Beer- 2 bottles a week Exercise:15-20 minutes 4-5 times a week, bike, floor exercise and walking.  USPSTF grade A and B recommendations  Depression:  Depression screen [facility] 2/9 11/03/2017 11/01/2016 04/15/2016 10/23/2015 10/21/2014  Decreased Interest 0 0 0 0 0  Down, Depressed, Hopeless 0 0 0 0 0  PHQ - 2 Score 0 0 0 0 0   Hypertension: BP Readings from Last 3 Encounters:  11/03/17 110/60  10/10/17 110/60  11/01/16 116/69   Obesity: Wt Readings from Last 3 Encounters:  11/03/17 152 lb 4.8 oz (69.1 kg)  10/10/17 152 lb 3.2 oz (69 kg)  11/01/16 154 lb (69.9 kg)   BMI Readings from Last 3 Encounters:  11/03/17 25.15 kg/m  10/10/17 25.33 kg/m  11/01/16 25.63 kg/m    Alcohol: occassional  Tobacco use: declines  HIV, hep B, hep C: declines  STD testing and prevention (chl/gon/syphilis): declines Intimate partner violence: declines Sexual History/Pain during Intercourse: denies Menstrual History/LMP/Abnormal Bleeding: regular periods on birth control; discusssed coming off of it to see if she menupausal Incontinence Symptoms: denies   Advanced Care Planning: A voluntary discussion about advance care planning including the explanation and discussion of advance directives.  Discussed health care proxy and Living will, and the patient was able to identify a health care proxy as husband Velinda Wrobel [phone].  Patient does not know have a living will at present time. If patient does have living will, I have requested they bring this to the clinic to be scanned in to  their chart.  Breast cancer:  HM Mammogram  Date Value Ref Range Status  10/20/2017 0-4 Bi-Rad 0-4 Bi-Rad, Self Reported Normal Final    Comment:    performed at [facility]. see report in Care Everywhere    Cervical cancer screening: 2018 up to date   Osteoporosis: takes vitamin D, weight bearing exercises  No results found for: HMDEXASCAN  Fall prevention/vitamin D: is taking  Lipids:  Lab Results  Component Value Date   CHOL 174 10/13/2017   CHOL 190 10/19/2016   CHOL 188 04/06/2016   Lab Results  Component Value Date   HDL 56 10/13/2017   HDL 56 10/19/2016   HDL 55 04/06/2016   Lab Results  Component Value Date   LDLCALC 86 10/13/2017   LDLCALC 88 10/19/2016   LDLCALC 99 04/06/2016   Lab Results  Component Value Date   TRIG 159 (H) 10/13/2017   TRIG 230 (A) 10/19/2016   TRIG 171 (H) 04/06/2016   Lab Results  Component Value Date   CHOLHDL 3.1 10/13/2017   CHOLHDL 4.2 10/14/2015   CHOLHDL 4.3 10/16/2014   No results found for: LDLDIRECT  Glucose:  Glucose  Date Value Ref Range Status  10/13/2017 88 65 - 99 mg/dL Final  10/14/2015 80 65 - 99 mg/dL Final  10/16/2014 84 65 - 99 mg/dL Final    Skin cancer: denies personal/family history, occasionally uses sunscreen  Colorectal cancer: 52 years old. Last one was in 2017 with 5 years reccomendations Aspirin: stopped daily  aspirin    Patient Active Problem List   Diagnosis Date Noted  . Dyslipidemia 10/18/2014  . Keratosis pilaris 10/18/2014  . Subclinical hypothyroidism 10/18/2014  . Vitamin D deficiency 10/18/2014  . History of Hashimoto thyroiditis 03/18/2013    Past Surgical History:  Procedure Laterality Date  . CESAREAN SECTION  1991  . CESAREAN SECTION  1992  . COLONOSCOPY WITH PROPOFOL N/A 04/19/2016   Procedure: COLONOSCOPY WITH PROPOFOL;  Surgeon: Lollie Sails, MD;  Location: [facility] ENDOSCOPY;  Service: Endoscopy;  Laterality: N/A;    Family History  Problem Relation Age of Onset  .  Heart disease Father   . Cancer Maternal Grandmother        Stomach    Social History   Socioeconomic History  . Marital status: Married    Spouse name: Midwife  . Number of children: 2  . Years of education: 29  . Highest education level: Bachelor's degree (e.g., BA, AB, BS)  Occupational History  . Not on file  Social Needs  . Financial resource strain: Not hard at all  . Food insecurity:    Worry: Never true    Inability: Never true  . Transportation needs:    Medical: No    Non-medical: No  Tobacco Use  . Smoking status: Never Smoker  . Smokeless tobacco: Never Used  Substance and Sexual Activity  . Alcohol use: Yes    Alcohol/week: 0.0 oz    Comment: occasionally  . Drug use: No  . Sexual activity: Yes    Partners: Male  Lifestyle  . Physical activity:    Days per week: 4 days    Minutes per session: 20 min  . Stress: Not at all  Relationships  . Social connections:    Talks on phone: More than three times a week    Gets together: Once a week    Attends religious service: Never    Active member of club or organization: No    Attends meetings of clubs or organizations: Never    Relationship status: Married  . Intimate partner violence:    Fear of current or ex partner: No    Emotionally abused: No    Physically abused: No    Forced sexual activity: No  Other Topics Concern  . Not on file  Social History Narrative  . Not on file     Current Outpatient Medications:  .  cholecalciferol (VITAMIN D) 1000 UNITS tablet, Take 2,000 Units by mouth daily., Disp: , Rfl:  .  omega-3 acid ethyl esters (LOVAZA) 1 g capsule, Take 2 capsules (2 g total) by mouth 2 (two) times daily., Disp: 120 capsule, Rfl: 11 .  SPRINTEC 28 0.25-35 MG-MCG tablet, Take 1 tablet by mouth daily., Disp: 3 Package, Rfl: 4  No Known Allergies   ROS Constitutional: Negative for fever or weight change.  Respiratory: Negative for cough and shortness of breath.    Cardiovascular: Negative for chest pain or palpitations.  Gastrointestinal: Negative for abdominal pain, no bowel changes.  Musculoskeletal: Negative for gait problem or joint swelling.  Skin: Negative for rash.  Neurological: Negative for dizziness or headache.  No other specific complaints in a complete review of systems (except as listed in HPI above).   Objective  Vitals:   11/03/17 0801 11/03/17 0906  BP: (!) 90/50 110/60  Pulse: 73   Resp: 16   Temp: 97.8 F (36.6 C)   TempSrc: Oral   SpO2: 96%   Weight: 152 lb  4.8 oz (69.1 kg)   Height: 5' 5.25" (1.657 m)     Body mass index is 25.15 kg/m.  Physical Exam  Constitutional: Patient appears well-developed and well-nourished. No distress.  HENT: Head: Normocephalic and atraumatic. Ears: B TMs ok, no erythema or effusion; Nose: Nose normal. Mouth/Throat: Oropharynx is clear and moist. No oropharyngeal exudate.  Eyes: Conjunctivae and EOM are normal. Pupils are equal, round, and reactive to light. No scleral icterus.  Neck: Normal range of motion. Neck supple. No JVD present. No thyromegaly present.  Cardiovascular: Normal rate, regular rhythm and normal heart sounds.  No murmur heard. No BLE edema. Pulmonary/Chest: Effort normal and breath sounds normal. No respiratory distress. Abdominal: Soft. Bowel sounds are normal, no distension. There is no tenderness. no masses Breast: no lumps or masses, no nipple discharge or rashes Musculoskeletal: Normal range of motion, no joint effusions. No gross deformities Neurological: he is alert and oriented to person, place, and time. No cranial nerve deficit. Coordination, balance, strength, speech and gait are normal.  Skin: Skin is warm and dry. No rash noted. No erythema.  Psychiatric: Patient has a normal mood and affect. behavior is normal. Judgment and thought content normal.   Recent Results (from the past 2160 hour(s))  CBC with Differential/Platelet     Status: None    Collection Time: 10/13/17  7:10 AM  Result Value Ref Range   WBC 7.1 3.4 - 10.8 x10E3/uL   RBC 4.80 3.77 - 5.28 x10E6/uL   Hemoglobin 13.7 11.1 - 15.9 g/dL   Hematocrit 41.5 34.0 - 46.6 %   MCV 87 79 - 97 fL   MCH 28.5 26.6 - 33.0 pg   MCHC 33.0 31.5 - 35.7 g/dL   RDW 13.7 12.3 - 15.4 %   Platelets 236 150 - 450 x10E3/uL   Neutrophils 65 Not Estab. %   Lymphs 29 Not Estab. %   Monocytes 6 Not Estab. %   Eos 0 Not Estab. %   Basos 0 Not Estab. %   Neutrophils Absolute 4.6 1.4 - 7.0 x10E3/uL   Lymphocytes Absolute 2.0 0.7 - 3.1 x10E3/uL   Monocytes Absolute 0.4 0.1 - 0.9 x10E3/uL   EOS (ABSOLUTE) 0.0 0.0 - 0.4 x10E3/uL   Basophils Absolute 0.0 0.0 - 0.2 x10E3/uL   Immature Granulocytes 0 Not Estab. %   Immature Grans (Abs) 0.0 0.0 - 0.1 x10E3/uL  Hemoglobin A1c     Status: None   Collection Time: 10/13/17  7:10 AM  Result Value Ref Range   Hgb A1c MFr Bld 5.4 4.8 - 5.6 %    Comment:          Prediabetes: 5.7 - 6.4          Diabetes: >6.4          Glycemic control for adults with diabetes: <7.0    Est. average glucose Bld gHb Est-mCnc 108 mg/dL  Lipid panel     Status: Abnormal   Collection Time: 10/13/17  7:10 AM  Result Value Ref Range   Cholesterol, Total 174 100 - 199 mg/dL   Triglycerides 159 (H) 0 - 149 mg/dL   HDL 56 >39 mg/dL   VLDL Cholesterol Cal 32 5 - 40 mg/dL   LDL Calculated 86 0 - 99 mg/dL   Chol/HDL Ratio 3.1 0.0 - 4.4 ratio    Comment:  T. Chol/HDL Ratio                                             Men  Women                               1/2 Avg.Risk  3.4    3.3                                   Avg.Risk  5.0    4.4                                2X Avg.Risk  9.6    7.1                                3X Avg.Risk 23.4   11.0   VITAMIN D 25 Hydroxy (Vit-D Deficiency, Fractures)     Status: None   Collection Time: 10/13/17  7:10 AM  Result Value Ref Range   Vit D, 25-Hydroxy 36.8 30.0 - 100.0 ng/mL    Comment: Vitamin D  deficiency has been defined by the [place] practice guideline as a level of serum 25-OH vitamin D less than 20 ng/mL (1,2). The Endocrine Society went on to further define vitamin D insufficiency as a level between 21 and 29 ng/mL (2). 1. IOM (Institute of Medicine). 2010. Dietary reference    intakes for calcium and D. [place]: The    [employer]. 2. Holick MF, Binkley Cornlea, Bischoff-Ferrari HA, et al.    Evaluation, treatment, and prevention of vitamin D    deficiency: an Endocrine Society clinical practice    guideline. JCEM. 2011 Jul; 96(7):1911-30.   Thyroid Panel With TSH     Status: Abnormal   Collection Time: 10/13/17  7:10 AM  Result Value Ref Range   TSH 4.020 0.450 - 4.500 uIU/mL   T4, Total 8.7 4.5 - 12.0 ug/dL   T3 Uptake Ratio 20 (L) 24 - 39 %   Free Thyroxine Index 1.7 1.2 - 4.9  Comprehensive metabolic panel     Status: Abnormal   Collection Time: 10/13/17  7:10 AM  Result Value Ref Range   Glucose 88 65 - 99 mg/dL   BUN 13 6 - 24 mg/dL   Creatinine, Ser 0.67 0.57 - 1.00 mg/dL   GFR calc non Af Amer 101 >59 mL/min/1.73   GFR calc Af Amer 117 >59 mL/min/1.73   BUN/Creatinine Ratio 19 9 - 23   Sodium 138 134 - 144 mmol/L   Potassium 5.3 (H) 3.5 - 5.2 mmol/L   Chloride 104 96 - 106 mmol/L   CO2 21 20 - 29 mmol/L   Calcium 9.4 8.7 - 10.2 mg/dL   Total Protein 7.2 6.0 - 8.5 g/dL   Albumin 4.3 3.5 - 5.5 g/dL   Globulin, Total 2.9 1.5 - 4.5 g/dL   Albumin/Globulin Ratio 1.5 1.2 - 2.2   Bilirubin Total 0.4 0.0 - 1.2 mg/dL   Alkaline Phosphatase 56 39 - 117 IU/L   AST 16 0 - 40 IU/L   ALT  14 0 - 32 IU/L  HM MAMMOGRAPHY     Status: None   Collection Time: 10/20/17 12:00 AM  Result Value Ref Range   HM Mammogram 0-4 Bi-Rad 0-4 Bi-Rad, Self Reported Normal    Comment: performed at [facility]. see report in Care Everywhere     Fall Risk: Fall Risk  11/03/2017 10/10/2017 11/01/2016 04/15/2016 10/23/2015  Falls in the  past year? No No No No No     Functional Status Survey: Is the patient deaf or have difficulty hearing?: No Does the patient have difficulty seeing, even when wearing glasses/contacts?: No Does the patient have difficulty concentrating, remembering, or making decisions?: No Does the patient have difficulty walking or climbing stairs?: No Does the patient have difficulty dressing or bathing?: No Does the patient have difficulty doing errands alone such as visiting a doctor's office or shopping?: No  Assessment & Plan  1. Well adult exam - discussed general healthy living- increasing exercise, adding vitamin K to regimen     2. Encounter for surveillance of contraceptive pills - discussed coming off ocp for 3 months and using back up bc method to find out if menopausal. Patient states will consider time to start - [place] 28 0.25-35 MG-MCG tablet; Take 1 tablet by mouth daily.  Dispense: 3 Package; Refill: 4  3. Hypertriglyceridemia -increase exercise  - omega-3 acid ethyl esters (LOVAZA) 1 g capsule; Take 2 capsules (2 g total) by mouth 2 (two) times daily.  Dispense: 120 capsule; Refill: 11   -USPSTF grade A and B recommendations reviewed with patient; age-appropriate recommendations, preventive care, screening tests, etc discussed and encouraged; healthy living encouraged; see AVS for patient education given to patient -Discussed importance of 150 minutes of physical activity weekly, eat two servings of fish weekly, eat one serving of tree nuts ( cashews, pistachios, pecans, almonds.Marland Kitchen) every other day, eat 6 servings of fruit/vegetables daily and drink plenty of water and avoid sweet beverages.  -Red flags and when to present for emergency care or RTC including fever >101.35F, chest pain, shortness of breath, new/worsening/un-resolving symptoms,  reviewed with patient at time of visit. Follow up and care instructions discussed and provided in  AVS.  -------------------------------------- I have reviewed this encounter including the documentation in this note and/or discussed this patient with the provider, Suezanne Cheshire DNP AGNP-C. I am certifying that I agree with the content of this note as supervising physician. Enid Derry, [facility] 11/03/2017, 5:11 PM

## 2018-10-22 ENCOUNTER — Telehealth: Payer: Self-pay

## 2018-10-22 DIAGNOSIS — E559 Vitamin D deficiency, unspecified: Secondary | ICD-10-CM

## 2018-10-22 DIAGNOSIS — R7303 Prediabetes: Secondary | ICD-10-CM

## 2018-10-22 DIAGNOSIS — E785 Hyperlipidemia, unspecified: Secondary | ICD-10-CM

## 2018-10-22 DIAGNOSIS — E038 Other specified hypothyroidism: Secondary | ICD-10-CM

## 2018-10-22 DIAGNOSIS — E039 Hypothyroidism, unspecified: Secondary | ICD-10-CM

## 2018-10-22 DIAGNOSIS — Z01419 Encounter for gynecological examination (general) (routine) without abnormal findings: Secondary | ICD-10-CM

## 2018-10-22 LAB — HM MAMMOGRAPHY

## 2018-10-22 NOTE — Telephone Encounter (Signed)
Copied from [place] [id number]. Topic: General - Call Back - No Documentation >> Oct 22, 2018  9:24 AM Erick Blinks wrote: Reason for CRM: Pt is requesting blood work before her physical. Please advise.  Best Contact: [phone]

## 2018-10-23 ENCOUNTER — Other Ambulatory Visit: Payer: Self-pay

## 2018-10-23 DIAGNOSIS — R7303 Prediabetes: Secondary | ICD-10-CM

## 2018-10-23 DIAGNOSIS — E559 Vitamin D deficiency, unspecified: Secondary | ICD-10-CM

## 2018-10-23 DIAGNOSIS — E039 Hypothyroidism, unspecified: Secondary | ICD-10-CM

## 2018-10-23 DIAGNOSIS — E038 Other specified hypothyroidism: Secondary | ICD-10-CM

## 2018-10-23 DIAGNOSIS — Z01419 Encounter for gynecological examination (general) (routine) without abnormal findings: Secondary | ICD-10-CM

## 2018-10-23 DIAGNOSIS — E785 Hyperlipidemia, unspecified: Secondary | ICD-10-CM

## 2018-10-23 NOTE — Telephone Encounter (Signed)
Patient would like to pick up lab orders today, please advise when ready

## 2018-10-23 NOTE — Telephone Encounter (Signed)
Patient states her lab order was already picked up.

## 2018-10-25 LAB — CBC WITH DIFFERENTIAL/PLATELET
Basophils Absolute: 0 10*3/uL (ref 0.0–0.2)
Basos: 1 %
EOS (ABSOLUTE): 0.1 10*3/uL (ref 0.0–0.4)
Eos: 1 %
Hematocrit: 43.4 % (ref 34.0–46.6)
Hemoglobin: 14.2 g/dL (ref 11.1–15.9)
Immature Grans (Abs): 0 10*3/uL (ref 0.0–0.1)
Immature Granulocytes: 1 %
Lymphocytes Absolute: 2 10*3/uL (ref 0.7–3.1)
Lymphs: 33 %
MCH: 28.8 pg (ref 26.6–33.0)
MCHC: 32.7 g/dL (ref 31.5–35.7)
MCV: 88 fL (ref 79–97)
Monocytes Absolute: 0.4 10*3/uL (ref 0.1–0.9)
Monocytes: 7 %
Neutrophils Absolute: 3.6 10*3/uL (ref 1.4–7.0)
Neutrophils: 57 %
Platelets: 208 10*3/uL (ref 150–450)
RBC: 4.93 x10E6/uL (ref 3.77–5.28)
RDW: 13.2 % (ref 11.7–15.4)
WBC: 6.2 10*3/uL (ref 3.4–10.8)

## 2018-10-25 LAB — COMPREHENSIVE METABOLIC PANEL
ALT: 21 IU/L (ref 0–32)
AST: 18 IU/L (ref 0–40)
Albumin/Globulin Ratio: 1.8 (ref 1.2–2.2)
Albumin: 4.4 g/dL (ref 3.8–4.9)
Alkaline Phosphatase: 52 IU/L (ref 39–117)
BUN/Creatinine Ratio: 20 (ref 9–23)
BUN: 14 mg/dL (ref 6–24)
Bilirubin Total: 0.3 mg/dL (ref 0.0–1.2)
CO2: 19 mmol/L — ABNORMAL LOW (ref 20–29)
Calcium: 9.3 mg/dL (ref 8.7–10.2)
Chloride: 104 mmol/L (ref 96–106)
Creatinine, Ser: 0.69 mg/dL (ref 0.57–1.00)
GFR calc Af Amer: 115 mL/min/{1.73_m2} (ref >59)
GFR calc non Af Amer: 100 mL/min/{1.73_m2} (ref >59)
Globulin, Total: 2.4 g/dL (ref 1.5–4.5)
Glucose: 90 mg/dL (ref 65–99)
Potassium: 4.7 mmol/L (ref 3.5–5.2)
Sodium: 138 mmol/L (ref 134–144)
Total Protein: 6.8 g/dL (ref 6.0–8.5)

## 2018-10-25 LAB — HEMOGLOBIN A1C
Est. average glucose Bld gHb Est-mCnc: 105 mg/dL
Hgb A1c MFr Bld: 5.3 % (ref 4.8–5.6)

## 2018-10-25 LAB — LIPID PANEL
Chol/HDL Ratio: 3.7 ratio (ref 0.0–4.4)
Cholesterol, Total: 176 mg/dL (ref 100–199)
HDL: 48 mg/dL (ref >39)
LDL Calculated: 97 mg/dL (ref 0–99)
Triglycerides: 154 mg/dL — ABNORMAL HIGH (ref 0–149)
VLDL Cholesterol Cal: 31 mg/dL (ref 5–40)

## 2018-10-25 LAB — TSH: TSH: 2.7 u[IU]/mL (ref 0.450–4.500)

## 2018-10-25 LAB — VITAMIN D 25 HYDROXY (VIT D DEFICIENCY, FRACTURES): Vit D, 25-Hydroxy: 32.4 ng/mL (ref 30.0–100.0)

## 2018-11-05 ENCOUNTER — Encounter: Payer: BLUE CROSS/BLUE SHIELD | Admitting: Family Medicine

## 2018-11-07 ENCOUNTER — Other Ambulatory Visit: Payer: Self-pay

## 2018-11-07 ENCOUNTER — Ambulatory Visit (INDEPENDENT_AMBULATORY_CARE_PROVIDER_SITE_OTHER): Payer: BC Managed Care – PPO | Admitting: Family Medicine

## 2018-11-07 ENCOUNTER — Encounter: Payer: Self-pay | Admitting: Family Medicine

## 2018-11-07 VITALS — BP 124/76 | HR 65 | Temp 97.3°F | Resp 16 | Ht 64.5 in | Wt 153.6 lb

## 2018-11-07 DIAGNOSIS — L858 Other specified epidermal thickening: Secondary | ICD-10-CM

## 2018-11-07 DIAGNOSIS — Z01419 Encounter for gynecological examination (general) (routine) without abnormal findings: Secondary | ICD-10-CM

## 2018-11-07 DIAGNOSIS — Z8639 Personal history of other endocrine, nutritional and metabolic disease: Secondary | ICD-10-CM

## 2018-11-07 DIAGNOSIS — E781 Pure hyperglyceridemia: Secondary | ICD-10-CM

## 2018-11-07 MED ORDER — OMEGA-3-ACID ETHYL ESTERS 1 G PO CAPS: 2.0000 | ORAL_CAPSULE | Freq: Two times a day (BID) | ORAL | 3 refills | Status: DC

## 2018-11-07 NOTE — Progress Notes (Signed)
Name: Autumn Long   MRN: [id number]    DOB: 01/15/1966   Date:11/07/2018       Progress Note  Subjective  Chief Complaint  Chief Complaint  Patient presents with  . Annual Exam    HPI   Patient presents for annual CPE   Hypertriglyceridemia: doing well on medication, no side effects and triglycerides almost normal range.   Keratosis pillari: using moisturizes and doing well   Diet: eats healthy meals  Exercise: regularly    USPSTF grade A and B recommendations   Depression: Phq 9 is  negative Depression screen PHQ 2/9 11/07/2018 11/03/2017 11/01/2016 04/15/2016 10/23/2015  Decreased Interest 0 0 0 0 0  Down, Depressed, Hopeless 0 0 0 0 0  PHQ - 2 Score 0 0 0 0 0  Altered sleeping 0 - - - -  Tired, decreased energy 0 - - - -  Change in appetite 0 - - - -  Feeling bad or failure about yourself  0 - - - -  Trouble concentrating 0 - - - -  Moving slowly or fidgety/restless 0 - - - -  Suicidal thoughts 0 - - - -  PHQ-9 Score 0 - - - -   Hypertension: BP Readings from Last 3 Encounters:  11/07/18 124/76  11/03/17 110/60  10/10/17 110/60   Obesity: Wt Readings from Last 3 Encounters:  11/07/18 153 lb 9.6 oz (69.7 kg)  11/03/17 152 lb 4.8 oz (69.1 kg)  10/10/17 152 lb 3.2 oz (69 kg)   BMI Readings from Last 3 Encounters:  11/07/18 25.96 kg/m  11/03/17 25.15 kg/m  10/10/17 25.33 kg/m    Hep C Screening: next year  STD testing and prevention (HIV/chl/gon/syphilis): N/A Intimate partner violence: negative screen  Sexual History/Pain during Intercourse: no pain with intercourse, no vaginal discharge  Menstrual History/LMP/Abnormal Bleeding: on ocp, we will stop it today, regular cycles, discussed irregular cycles when she stops ocp Incontinence Symptoms:   Advanced Care Planning: A voluntary discussion about advance care planning including the explanation and discussion of advance directives.  Discussed health care proxy and Living will, and the patient was able  to identify a health care proxy as husband .  Patient does have a living will at present time.  Breast cancer:  HM Mammogram  Date Value Ref Range Status  10/22/2018 0-4 Bi-Rad 0-4 Bi-Rad, Self Reported Normal Final    Comment:    [facility] in chart    BRCA gene screening:  Cervical cancer screening: up to date   Osteoporosis Screening: discussed high calcium and vitamin D    Lipids:  Lab Results  Component Value Date   CHOL 176 10/24/2018   CHOL 174 10/13/2017   CHOL 190 10/19/2016   Lab Results  Component Value Date   HDL 48 10/24/2018   HDL 56 10/13/2017   HDL 56 10/19/2016   Lab Results  Component Value Date   LDLCALC 97 10/24/2018   LDLCALC 86 10/13/2017   LDLCALC 88 10/19/2016   Lab Results  Component Value Date   TRIG 154 (H) 10/24/2018   TRIG 159 (H) 10/13/2017   TRIG 230 (A) 10/19/2016   Lab Results  Component Value Date   CHOLHDL 3.7 10/24/2018   CHOLHDL 3.1 10/13/2017   CHOLHDL 4.2 10/14/2015   No results found for: LDLDIRECT  Glucose:  Glucose  Date Value Ref Range Status  10/24/2018 90 65 - 99 mg/dL Final  10/13/2017 88 65 - 99 mg/dL Final  10/14/2015 80 65 -   99 mg/dL Final    Skin cancer: discussed atypical lesions  Colorectal cancer: up to date  Lung cancer:   Low Dose CT Chest recommended if Age 40-80 years, 30 pack-year currently smoking OR have quit w/in 15years. Patient does not qualify.   BCW:UGQBV   Patient Active Problem List   Diagnosis Date Noted  . Dyslipidemia 10/18/2014  . Keratosis pilaris 10/18/2014  . Subclinical hypothyroidism 10/18/2014  . Vitamin D deficiency 10/18/2014  . History of Hashimoto thyroiditis 03/18/2013    Past Surgical History:  Procedure Laterality Date  . CESAREAN SECTION  1991  . CESAREAN SECTION  1992  . COLONOSCOPY WITH PROPOFOL N/A 04/19/2016   Procedure: COLONOSCOPY WITH PROPOFOL;  Surgeon: Lollie Sails, MD;  Location: [facility] ENDOSCOPY;  Service: Endoscopy;  Laterality: N/A;    Family  History  Problem Relation Age of Onset  . Heart disease Father   . Cancer Maternal Grandmother        Stomach    Social History   Socioeconomic History  . Marital status: Married    Spouse name: Midwife  . Number of children: 2  . Years of education: 33  . Highest education level: Bachelor's degree (e.g., BA, AB, BS)  Occupational History  . Not on file  Social Needs  . Financial resource strain: Not hard at all  . Food insecurity    Worry: Never true    Inability: Never true  . Transportation needs    Medical: No    Non-medical: No  Tobacco Use  . Smoking status: Never Smoker  . Smokeless tobacco: Never Used  Substance and Sexual Activity  . Alcohol use: Yes    Alcohol/week: 0.0 standard drinks    Comment: occasionally  . Drug use: No  . Sexual activity: Yes    Partners: Male  Lifestyle  . Physical activity    Days per week: 4 days    Minutes per session: 20 min  . Stress: Not at all  Relationships  . Social connections    Talks on phone: More than three times a week    Gets together: Once a week    Attends religious service: Never    Active member of club or organization: No    Attends meetings of clubs or organizations: Never    Relationship status: Married  . Intimate partner violence    Fear of current or ex partner: No    Emotionally abused: No    Physically abused: No    Forced sexual activity: No  Other Topics Concern  . Not on file  Social History Narrative  . Not on file     Current Outpatient Medications:  .  cholecalciferol (VITAMIN D) 1000 UNITS tablet, Take 2,000 Units by mouth daily., Disp: , Rfl:  .  omega-3 acid ethyl esters (LOVAZA) 1 g capsule, Take 2 capsules (2 g total) by mouth 2 (two) times daily., Disp: 360 capsule, Rfl: 3  No Known Allergies   ROS  Constitutional: Negative for fever or weight change.  Respiratory: Negative for cough and shortness of breath.   Cardiovascular: Negative for chest pain or  palpitations.  Gastrointestinal: Negative for abdominal pain, no bowel changes.  Musculoskeletal: Negative for gait problem or joint swelling.  Skin: Negative for rash.  Neurological: Negative for dizziness or headache.  No other specific complaints in a complete review of systems (except as listed in HPI above).  Objective  Vitals:   11/07/18 1500  BP: 124/76  Pulse: 65  Resp: 16  Temp: (!) 97.3 F (36.3 C)  TempSrc: Oral  SpO2: 100%  Weight: 153 lb 9.6 oz (69.7 kg)  Height: 5' 4.5" (1.638 m)    Body mass index is 25.96 kg/m.  Physical Exam  Constitutional: Patient appears well-developed and well-nourished. No distress.  HENT: Head: Normocephalic and atraumatic. Ears: B TMs ok, no erythema or effusion; Nose: Nose normal. Mouth/Throat: Oropharynx is clear and moist. No oropharyngeal exudate.  Eyes: Conjunctivae and EOM are normal. Pupils are equal, round, and reactive to light. No scleral icterus.  Neck: Normal range of motion. Neck supple. No JVD present. No thyromegaly present.  Cardiovascular: Normal rate, regular rhythm and normal heart sounds.  No murmur heard. No BLE edema. Pulmonary/Chest: Effort normal and breath sounds normal. No respiratory distress. Abdominal: Soft. Bowel sounds are normal, no distension. There is no tenderness. no masses Breast: no lumps or masses, no nipple discharge or rashes FEMALE GENITALIA:  Not done  RECTAL: not done  Musculoskeletal: Normal range of motion, no joint effusions. No gross deformities Neurological: he is alert and oriented to person, place, and time. No cranial nerve deficit. Coordination, balance, strength, speech and gait are normal.  Skin: Skin is warm and dry. No rash noted. No erythema.  Psychiatric: Patient has a normal mood and affect. behavior is normal. Judgment and thought content normal.   Recent Results (from the past 2160 hour(s))  HM MAMMOGRAPHY     Status: None   Collection Time: 10/22/18 12:00 AM  Result  Value Ref Range   HM Mammogram 0-4 Bi-Rad 0-4 Bi-Rad, Self Reported Normal    Comment: [facility] in chart  TSH     Status: None   Collection Time: 10/24/18  7:12 AM  Result Value Ref Range   TSH 2.700 0.450 - 4.500 uIU/mL  Lipid panel     Status: Abnormal   Collection Time: 10/24/18  7:12 AM  Result Value Ref Range   Cholesterol, Total 176 100 - 199 mg/dL   Triglycerides 154 (H) 0 - 149 mg/dL   HDL 48 >39 mg/dL   VLDL Cholesterol Cal 31 5 - 40 mg/dL   LDL Calculated 97 0 - 99 mg/dL   Chol/HDL Ratio 3.7 0.0 - 4.4 ratio    Comment:                                   T. Chol/HDL Ratio                                             Men  Women                               1/2 Avg.Risk  3.4    3.3                                   Avg.Risk  5.0    4.4                                2X Avg.Risk  9.6    7.1  3X Avg.Risk 23.4   11.0   VITAMIN D 25 Hydroxy (Vit-D Deficiency, Fractures)     Status: None   Collection Time: 10/24/18  7:12 AM  Result Value Ref Range   Vit D, 25-Hydroxy 32.4 30.0 - 100.0 ng/mL    Comment: Vitamin D deficiency has been defined by the [place] practice guideline as a level of serum 25-OH vitamin D less than 20 ng/mL (1,2). The Endocrine Society went on to further define vitamin D insufficiency as a level between 21 and 29 ng/mL (2). 1. IOM (Institute of Medicine). 2010. Dietary reference    intakes for calcium and D. [place]: The    [employer]. 2. Holick MF, Binkley Perris, Bischoff-Ferrari HA, et al.    Evaluation, treatment, and prevention of vitamin D    deficiency: an Endocrine Society clinical practice    guideline. JCEM. 2011 Jul; 96(7):1911-30.   Hemoglobin A1c     Status: None   Collection Time: 10/24/18  7:12 AM  Result Value Ref Range   Hgb A1c MFr Bld 5.3 4.8 - 5.6 %    Comment:          Prediabetes: 5.7 - 6.4          Diabetes: >6.4          Glycemic control for adults  with diabetes: <7.0    Est. average glucose Bld gHb Est-mCnc 105 mg/dL  Comprehensive metabolic panel     Status: Abnormal   Collection Time: 10/24/18  7:12 AM  Result Value Ref Range   Glucose 90 65 - 99 mg/dL   BUN 14 6 - 24 mg/dL   Creatinine, Ser 0.69 0.57 - 1.00 mg/dL   GFR calc non Af Amer 100 >59 mL/min/1.73   GFR calc Af Amer 115 >59 mL/min/1.73   BUN/Creatinine Ratio 20 9 - 23   Sodium 138 134 - 144 mmol/L   Potassium 4.7 3.5 - 5.2 mmol/L   Chloride 104 96 - 106 mmol/L   CO2 19 (L) 20 - 29 mmol/L   Calcium 9.3 8.7 - 10.2 mg/dL   Total Protein 6.8 6.0 - 8.5 g/dL   Albumin 4.4 3.8 - 4.9 g/dL   Globulin, Total 2.4 1.5 - 4.5 g/dL   Albumin/Globulin Ratio 1.8 1.2 - 2.2   Bilirubin Total 0.3 0.0 - 1.2 mg/dL   Alkaline Phosphatase 52 39 - 117 IU/L   AST 18 0 - 40 IU/L   ALT 21 0 - 32 IU/L  CBC with Differential/Platelet     Status: None   Collection Time: 10/24/18  7:12 AM  Result Value Ref Range   WBC 6.2 3.4 - 10.8 x10E3/uL   RBC 4.93 3.77 - 5.28 x10E6/uL   Hemoglobin 14.2 11.1 - 15.9 g/dL   Hematocrit 43.4 34.0 - 46.6 %   MCV 88 79 - 97 fL   MCH 28.8 26.6 - 33.0 pg   MCHC 32.7 31.5 - 35.7 g/dL   RDW 13.2 11.7 - 15.4 %   Platelets 208 150 - 450 x10E3/uL   Neutrophils 57 Not Estab. %   Lymphs 33 Not Estab. %   Monocytes 7 Not Estab. %   Eos 1 Not Estab. %   Basos 1 Not Estab. %   Neutrophils Absolute 3.6 1.4 - 7.0 x10E3/uL   Lymphocytes Absolute 2.0 0.7 - 3.1 x10E3/uL   Monocytes Absolute 0.4 0.1 - 0.9 x10E3/uL   EOS (ABSOLUTE) 0.1 0.0 - 0.4 x10E3/uL  Basophils Absolute 0.0 0.0 - 0.2 x10E3/uL   Immature Granulocytes 1 Not Estab. %   Immature Grans (Abs) 0.0 0.0 - 0.1 x10E3/uL      PHQ2/9: Depression screen PHQ 2/9 11/07/2018 11/03/2017 11/01/2016 04/15/2016 10/23/2015  Decreased Interest 0 0 0 0 0  Down, Depressed, Hopeless 0 0 0 0 0  PHQ - 2 Score 0 0 0 0 0  Altered sleeping 0 - - - -  Tired, decreased energy 0 - - - -  Change in appetite 0 - - - -  Feeling  bad or failure about yourself  0 - - - -  Trouble concentrating 0 - - - -  Moving slowly or fidgety/restless 0 - - - -  Suicidal thoughts 0 - - - -  PHQ-9 Score 0 - - - -     Fall Risk: Fall Risk  11/07/2018 11/03/2017 10/10/2017 11/01/2016 04/15/2016  Falls in the past year? 0 No No No No  Number falls in past yr: 0 - - - -  Injury with Fall? 0 - - - -     Functional Status Survey: Is the patient deaf or have difficulty hearing?: No Does the patient have difficulty seeing, even when wearing glasses/contacts?: No Does the patient have difficulty concentrating, remembering, or making decisions?: No Does the patient have difficulty walking or climbing stairs?: No Does the patient have difficulty dressing or bathing?: No Does the patient have difficulty doing errands alone such as visiting a doctor's office or shopping?: No   Assessment & Plan  1. Hypertriglyceridemia  - omega-3 acid ethyl esters (LOVAZA) 1 g capsule; Take 2 capsules (2 g total) by mouth 2 (two) times daily.  Dispense: 360 capsule; Refill: 3  2. Well woman exam   3. Keratosis pilaris   4. History of Hashimoto thyroiditis    -USPSTF grade A and B recommendations reviewed with patient; age-appropriate recommendations, preventive care, screening tests, etc discussed and encouraged; healthy living encouraged; see AVS for patient education given to patient -Discussed importance of 150 minutes of physical activity weekly, eat two servings of fish weekly, eat one serving of tree nuts ( cashews, pistachios, pecans, almonds..) every other day, eat 6 servings of fruit/vegetables daily and drink plenty of water and avoid sweet beverages.    

## 2019-06-29 ENCOUNTER — Ambulatory Visit: Payer: BC Managed Care – PPO | Attending: Internal Medicine

## 2019-06-29 DIAGNOSIS — Z23 Encounter for immunization: Secondary | ICD-10-CM | POA: Insufficient documentation

## 2019-06-29 NOTE — Progress Notes (Signed)
   Covid-19 Vaccination Clinic  Name:  Tsion Realmuto    MRN: [id number] DOB: 10/18/1965  06/29/2019  Ms. Fecher was observed post Covid-19 immunization for 15 minutes without incidence. She was provided with Vaccine Information Sheet and instruction to access the V-Safe system.   Ms. Paramo was instructed to call 911 with any severe reactions post vaccine: Marland Kitchen Difficulty breathing  . Swelling of your face and throat  . A fast heartbeat  . A bad rash all over your body  . Dizziness and weakness    Immunizations Administered    Name Date Dose VIS Date Route   Pfizer COVID-19 Vaccine 06/29/2019  3:41 PM 0.3 mL 04/12/2019 Intramuscular   Manufacturer: [place]   Lot: [id number]   [place]: [id number]

## 2019-07-20 ENCOUNTER — Ambulatory Visit: Payer: Self-pay | Attending: Internal Medicine

## 2019-07-20 DIAGNOSIS — Z23 Encounter for immunization: Secondary | ICD-10-CM

## 2019-07-20 NOTE — Progress Notes (Signed)
   Covid-19 Vaccination Clinic  Name:  Autumn Long    MRN: [id number] DOB: 1966-03-26  07/20/2019  Autumn Long was observed post Covid-19 immunization for 15 minutes without incident. She was provided with Vaccine Information Sheet and instruction to access the V-Safe system.   Autumn Long was instructed to call 911 with any severe reactions post vaccine: Marland Kitchen Difficulty breathing  . Swelling of face and throat  . A fast heartbeat  . A bad rash all over body  . Dizziness and weakness   Immunizations Administered    Name Date Dose VIS Date Route   Pfizer COVID-19 Vaccine 07/20/2019  8:28 AM 0.3 mL 04/12/2019 Intramuscular   Manufacturer: [place]   Lot: [id number]   [place]: [id number]

## 2019-07-25 ENCOUNTER — Other Ambulatory Visit: Payer: Self-pay

## 2019-07-25 ENCOUNTER — Inpatient Hospital Stay (HOSPITAL_COMMUNITY)
Admission: EM | Admit: 2019-07-25 | Discharge: 2019-07-29 | DRG: 083 | Disposition: A | Discharge status: Home / Self Care | Payer: PRIVATE HEALTH INSURANCE | Attending: Neurological Surgery | Admitting: Neurological Surgery

## 2019-07-25 ENCOUNTER — Emergency Department (HOSPITAL_COMMUNITY): Payer: PRIVATE HEALTH INSURANCE

## 2019-07-25 DIAGNOSIS — Z20822 Contact with and (suspected) exposure to covid-19: Secondary | ICD-10-CM | POA: Diagnosis present

## 2019-07-25 DIAGNOSIS — Y990 Civilian activity done for income or pay: Secondary | ICD-10-CM

## 2019-07-25 DIAGNOSIS — S0219XA Other fracture of base of skull, initial encounter for closed fracture: Secondary | ICD-10-CM | POA: Diagnosis present

## 2019-07-25 DIAGNOSIS — E559 Vitamin D deficiency, unspecified: Secondary | ICD-10-CM | POA: Diagnosis present

## 2019-07-25 DIAGNOSIS — Y92219 Unspecified school as the place of occurrence of the external cause: Secondary | ICD-10-CM

## 2019-07-25 DIAGNOSIS — R4701 Aphasia: Secondary | ICD-10-CM | POA: Diagnosis present

## 2019-07-25 DIAGNOSIS — E785 Hyperlipidemia, unspecified: Secondary | ICD-10-CM | POA: Diagnosis present

## 2019-07-25 DIAGNOSIS — Z8249 Family history of ischemic heart disease and other diseases of the circulatory system: Secondary | ICD-10-CM

## 2019-07-25 DIAGNOSIS — S06359A Traumatic hemorrhage of left cerebrum with loss of consciousness of unspecified duration, initial encounter: Secondary | ICD-10-CM | POA: Diagnosis present

## 2019-07-25 DIAGNOSIS — I619 Nontraumatic intracerebral hemorrhage, unspecified: Secondary | ICD-10-CM

## 2019-07-25 DIAGNOSIS — S066X9A Traumatic subarachnoid hemorrhage with loss of consciousness of unspecified duration, initial encounter: Principal | ICD-10-CM | POA: Diagnosis present

## 2019-07-25 DIAGNOSIS — R739 Hyperglycemia, unspecified: Secondary | ICD-10-CM | POA: Diagnosis present

## 2019-07-25 DIAGNOSIS — S061X9A Traumatic cerebral edema with loss of consciousness of unspecified duration, initial encounter: Secondary | ICD-10-CM | POA: Diagnosis present

## 2019-07-25 DIAGNOSIS — Y9301 Activity, walking, marching and hiking: Secondary | ICD-10-CM | POA: Diagnosis present

## 2019-07-25 DIAGNOSIS — E039 Hypothyroidism, unspecified: Secondary | ICD-10-CM | POA: Diagnosis present

## 2019-07-25 DIAGNOSIS — E038 Other specified hypothyroidism: Secondary | ICD-10-CM | POA: Diagnosis present

## 2019-07-25 DIAGNOSIS — S0635AA Traumatic hemorrhage of left cerebrum with loss of consciousness status unknown, initial encounter: Secondary | ICD-10-CM | POA: Diagnosis present

## 2019-07-25 DIAGNOSIS — R402362 Coma scale, best motor response, obeys commands, at arrival to emergency department: Secondary | ICD-10-CM | POA: Diagnosis present

## 2019-07-25 DIAGNOSIS — W010XXA Fall on same level from slipping, tripping and stumbling without subsequent striking against object, initial encounter: Secondary | ICD-10-CM | POA: Diagnosis present

## 2019-07-25 DIAGNOSIS — S065X9A Traumatic subdural hemorrhage with loss of consciousness of unspecified duration, initial encounter: Secondary | ICD-10-CM | POA: Diagnosis present

## 2019-07-25 DIAGNOSIS — R402142 Coma scale, eyes open, spontaneous, at arrival to emergency department: Secondary | ICD-10-CM | POA: Diagnosis present

## 2019-07-25 DIAGNOSIS — S065X0A Traumatic subdural hemorrhage without loss of consciousness, initial encounter: Secondary | ICD-10-CM | POA: Diagnosis present

## 2019-07-25 DIAGNOSIS — E781 Pure hyperglyceridemia: Secondary | ICD-10-CM | POA: Diagnosis present

## 2019-07-25 DIAGNOSIS — R402252 Coma scale, best verbal response, oriented, at arrival to emergency department: Secondary | ICD-10-CM | POA: Diagnosis present

## 2019-07-25 LAB — COMPREHENSIVE METABOLIC PANEL
ALT: 41 U/L (ref 0–44)
AST: 31 U/L (ref 15–41)
Albumin: 4.3 g/dL (ref 3.5–5.0)
Alkaline Phosphatase: 74 U/L (ref 38–126)
Anion gap: 10 (ref 5–15)
BUN: 10 mg/dL (ref 6–20)
CO2: 25 mmol/L (ref 22–32)
Calcium: 9.5 mg/dL (ref 8.9–10.3)
Chloride: 102 mmol/L (ref 98–111)
Creatinine, Ser: 0.6 mg/dL (ref 0.44–1.00)
GFR calc Af Amer: >60 mL/min (ref >60)
GFR calc non Af Amer: >60 mL/min (ref >60)
Glucose, Bld: 139 mg/dL — ABNORMAL HIGH (ref 70–99)
Potassium: 4.2 mmol/L (ref 3.5–5.1)
Sodium: 137 mmol/L (ref 135–145)
Total Bilirubin: 0.6 mg/dL (ref 0.3–1.2)
Total Protein: 7.9 g/dL (ref 6.5–8.1)

## 2019-07-25 LAB — CBC WITH DIFFERENTIAL/PLATELET
Abs Immature Granulocytes: 0.05 10*3/uL (ref 0.00–0.07)
Basophils Absolute: 0 10*3/uL (ref 0.0–0.1)
Basophils Relative: 0 %
Eosinophils Absolute: 0 10*3/uL (ref 0.0–0.5)
Eosinophils Relative: 0 %
HCT: 43.5 % (ref 36.0–46.0)
Hemoglobin: 14.2 g/dL (ref 12.0–15.0)
Immature Granulocytes: 1 %
Lymphocytes Relative: 9 %
Lymphs Abs: 0.9 10*3/uL (ref 0.7–4.0)
MCH: 28 pg (ref 26.0–34.0)
MCHC: 32.6 g/dL (ref 30.0–36.0)
MCV: 85.6 fL (ref 80.0–100.0)
Monocytes Absolute: 0.3 10*3/uL (ref 0.1–1.0)
Monocytes Relative: 3 %
Neutro Abs: 9.2 10*3/uL — ABNORMAL HIGH (ref 1.7–7.7)
Neutrophils Relative %: 87 %
Platelets: 186 10*3/uL (ref 150–400)
RBC: 5.08 MIL/uL (ref 3.87–5.11)
RDW: 12.8 % (ref 11.5–15.5)
WBC: 10.5 10*3/uL (ref 4.0–10.5)
nRBC: 0 % (ref 0.0–0.2)

## 2019-07-25 LAB — PROTIME-INR
INR: 1 (ref 0.8–1.2)
Prothrombin Time: 12.6 seconds (ref 11.4–15.2)

## 2019-07-25 LAB — RESPIRATORY PANEL BY RT PCR (FLU A&B, COVID)
Influenza A by PCR: NEGATIVE
Influenza B by PCR: NEGATIVE
SARS Coronavirus 2 by RT PCR: NEGATIVE

## 2019-07-25 LAB — APTT: aPTT: 29 seconds (ref 24–36)

## 2019-07-25 MED ORDER — HYDROCODONE-ACETAMINOPHEN 5-325 MG PO TABS
1.0000 | ORAL_TABLET | Freq: Once | ORAL | Status: AC
  Administered 2019-07-25: 1 via ORAL
  Filled 2019-07-25: qty 1

## 2019-07-25 MED ORDER — FENTANYL CITRATE (PF) 100 MCG/2ML IJ SOLN
50.0000 ug | Freq: Once | INTRAMUSCULAR | Status: AC
  Administered 2019-07-25: 50 ug via INTRAVENOUS
  Filled 2019-07-25: qty 2

## 2019-07-25 MED ORDER — SODIUM CHLORIDE 0.9 % IV SOLN: INTRAVENOUS | Status: DC

## 2019-07-25 NOTE — ED Triage Notes (Signed)
Pt was walking backwards at her place of work, tripped and fell, hit the back of her head. No blood thinners. No LOC. Pt having repetitive questioning, but can answer all orientation questions correctly. No other neuro symptoms. Ambulatory after fall.

## 2019-07-25 NOTE — H&P (Signed)
[facility] is an 54 y.o. female.   Chief Complaint: Head injury status post fall backwards on her left side. HPI: Patient is a 54 year old right-handed female who apparently sustained a fall while walking backwards at school.  She struck the left parieto-occipital region and there is no documented loss of consciousness but the patient became violently ill thereafter and she was ultimately brought to the emergency department where a CT scan of the head was performed and demonstrates presence of a left temporal contusion with subarachnoid hemorrhage over the convexity and a very thin subdural hematoma.  There is a comminuted temporal bone fracture just above the petrous ridge.  Patient notes that he has muffled hearing on the left side but has significant difficulty communicating with the word finding difficulties and right left confusion that is evident.  Past Medical History:  Diagnosis Date  . Hematuria   . Hyperlipidemia   . Hypothyroidism   . Keratosis pilaris   . Vitamin D deficiency     Past Surgical History:  Procedure Laterality Date  . CESAREAN SECTION  1991  . CESAREAN SECTION  1992  . COLONOSCOPY WITH PROPOFOL N/A 04/19/2016   Procedure: COLONOSCOPY WITH PROPOFOL;  Surgeon: Lollie Sails, MD;  Location: [facility] ENDOSCOPY;  Service: Endoscopy;  Laterality: N/A;    Family History  Problem Relation Age of Onset  . Heart disease Father   . Cancer Maternal Grandmother        Stomach   Social History:  reports that she has never smoked. She has never used smokeless tobacco. She reports current alcohol use. She reports that she does not use drugs.  Allergies: No Known Allergies  (Not in a hospital admission)   Results for orders placed or performed during the hospital encounter of 07/25/19 (from the past 48 hour(s))  CBC with Differential/Platelet     Status: Abnormal   Collection Time: 07/25/19  8:38 PM  Result Value Ref Range   WBC 10.5 4.0 - 10.5 K/uL   RBC 5.08  3.87 - 5.11 MIL/uL   Hemoglobin 14.2 12.0 - 15.0 g/dL   HCT 43.5 36.0 - 46.0 %   MCV 85.6 80.0 - 100.0 fL   MCH 28.0 26.0 - 34.0 pg   MCHC 32.6 30.0 - 36.0 g/dL   RDW 12.8 11.5 - 15.5 %   Platelets 186 150 - 400 K/uL   nRBC 0.0 0.0 - 0.2 %   Neutrophils Relative % 87 %   Neutro Abs 9.2 (H) 1.7 - 7.7 K/uL   Lymphocytes Relative 9 %   Lymphs Abs 0.9 0.7 - 4.0 K/uL   Monocytes Relative 3 %   Monocytes Absolute 0.3 0.1 - 1.0 K/uL   Eosinophils Relative 0 %   Eosinophils Absolute 0.0 0.0 - 0.5 K/uL   Basophils Relative 0 %   Basophils Absolute 0.0 0.0 - 0.1 K/uL   Immature Granulocytes 1 %   Abs Immature Granulocytes 0.05 0.00 - 0.07 K/uL    Comment: Performed at [facility], [address], [place], [place] [address]  Comprehensive metabolic panel     Status: Abnormal   Collection Time: 07/25/19  8:38 PM  Result Value Ref Range   Sodium 137 135 - 145 mmol/L   Potassium 4.2 3.5 - 5.1 mmol/L   Chloride 102 98 - 111 mmol/L   CO2 25 22 - 32 mmol/L   Glucose, Bld 139 (H) 70 - 99 mg/dL    Comment: Glucose reference range applies only to  samples taken after fasting for at least 8 hours.   BUN 10 6 - 20 mg/dL   Creatinine, Ser 0.60 0.44 - 1.00 mg/dL   Calcium 9.5 8.9 - 10.3 mg/dL   Total Protein 7.9 6.5 - 8.1 g/dL   Albumin 4.3 3.5 - 5.0 g/dL   AST 31 15 - 41 U/L   ALT 41 0 - 44 U/L   Alkaline Phosphatase 74 38 - 126 U/L   Total Bilirubin 0.6 0.3 - 1.2 mg/dL   GFR calc non Af Amer >60 >60 mL/min   GFR calc Af Amer >60 >60 mL/min   Anion gap 10 5 - 15    Comment: Performed at [facility], [place] [address] [place], [place] [address]  Protime-INR     Status: None   Collection Time: 07/25/19  8:38 PM  Result Value Ref Range   Prothrombin Time 12.6 11.4 - 15.2 seconds   INR 1.0 0.8 - 1.2    Comment: (NOTE) INR goal varies based on device and disease states. Performed at [facility], [place] [address], [place], [place] [address]   APTT     Status: None    Collection Time: 07/25/19  8:38 PM  Result Value Ref Range   aPTT 29 24 - 36 seconds    Comment: Performed at [facility] [address] [place], [place] [address]   CT Head Wo Contrast  Result Date: 07/25/2019 CLINICAL DATA:  Fall with head trauma EXAM: CT HEAD WITHOUT CONTRAST TECHNIQUE: Contiguous axial images were obtained from the base of the skull through the vertex without intravenous contrast. COMPARISON:  None. FINDINGS: Brain: Intraparenchymal contusion within the anterior left temporal lobe measures approximately 3.9 x 2.3 x 2.8 cm (volume = 13 cm^3). There is a large amount of subarachnoid blood over the left convexity. There is slight rightward midline shift, which measures 3 mm. No hydrocephalus. Vascular: No hyperdense vessel or unexpected calcification. Skull: There is a fracture through the petrous portion of the left temporal bone that traverses the anterior mastoid air cells. There is blood within the dependent portion of the left middle ear and within the mastoid. The fracture does not appear to involve the otic capsule. Sinuses/Orbits: No acute finding. Other: None. IMPRESSION: 1. Left temporal lobe intraparenchymal contusion with large amount of subarachnoid blood over the left convexity and slight rightward midline shift. 2. Fracture of the petrous portion of the left temporal bone with blood within the left middle ear and mastoid. No visualized otic capsule involvement. Critical Value/emergent results were called by telephone at the time of interpretation on 07/25/2019 at 8:04 pm to provider [facility] , who verbally acknowledged these results. Electronically Signed   By: Ulyses Jarred M.D.   On: 07/25/2019 20:04   CT Cervical Spine Wo Contrast  Result Date: 07/25/2019 CLINICAL DATA:  Status post fall. EXAM: CT CERVICAL SPINE WITHOUT CONTRAST TECHNIQUE: Multidetector CT imaging of the cervical spine was performed without intravenous contrast. Multiplanar CT image  reconstructions were also generated. COMPARISON:  None. FINDINGS: Alignment: Normal. Skull base and vertebrae: No acute fracture is seen involving the cervical spine. A comminuted nondisplaced fracture is seen involving the temporal bones on the left. This extends through the mastoid air cells to the posterior aspect of the left temporomandibular joint. Soft tissues and spinal canal: No prevertebral fluid or swelling. No visible canal hematoma. Disc levels: Normal cervical spine endplates are seen with normal multilevel intervertebral disc spaces. Upper chest:  Negative. Other: A mild amount of soft tissue air is seen along the anterior aspect of the temporal bones on the left. IMPRESSION: 1. Comminuted nondisplaced fracture involving the left temporal bone. MRI correlation is recommended. 2. No acute fracture or subluxation is seen involving the cervical spine. Electronically Signed   By: Virgina Norfolk M.D.   On: 07/25/2019 20:11    Review of Systems  Constitutional: Negative.   HENT: Negative.   Eyes: Negative.   Respiratory: Negative.   Cardiovascular: Negative.   Gastrointestinal: Negative.   Endocrine: Negative.   Musculoskeletal: Negative.   Allergic/Immunologic: Negative.   Neurological:       Speech difficulties noted today since time of fall.  Hematological: Negative.   Psychiatric/Behavioral: Negative.     Blood pressure 137/84, pulse 69, temperature 98.7 F (37.1 C), temperature source Oral, resp. rate (!) 22, weight 68 kg, SpO2 99 %. Physical Exam  Constitutional: She is oriented to person, place, and time. She appears well-developed and well-nourished.  HENT:  No external signs of trauma are noted particularly in the left parietal and occipital regions there are no lumps or bumps that are palpable under the patient's rather thick hair.  Eyes: Pupils are equal, round, and reactive to light. Conjunctivae and EOM are normal.  Cardiovascular: Normal rate and regular rhythm.   Respiratory: Effort normal and breath sounds normal.  GI: Soft. Bowel sounds are normal.  Musculoskeletal:     Cervical back: Normal range of motion and neck supple.  Neurological: She is alert and oriented to person, place, and time.  Significant word finding difficulties are noted in the patient's speech she tries to maintain a normal cadence but missed names things and objects.  She has obvious right left confusion and spontaneously moves the left upper extremity more and needs substantial coaxing with lifting of the right arm however when testing strength of the strength appears to be symmetric without any evidence of a drift motor strength volitionally appears to show some weakness in the grip strength on the right.  Pupils are 5 mm and briskly reactive the extraocular movements are full the face is symmetric tongue and uvula protrude in the midline grimace is symmetric ago she does note some left-sided jaw pain.  Skin: Skin is warm and dry.  Psychiatric: She has a normal mood and affect. Her behavior is normal. Judgment and thought content normal.     Assessment/Plan Closed head injury with temporal bone fracture and large temporal contusion with subarachnoid hemorrhage that is traumatic and evidence of a mild amount of shift.  Plan: The patient will be admitted for observation and repeat head CT in the morning and evaluation by speech therapy will be necessary.  I spent approximately 15 minutes counseling the patient and the husband regarding the nature of the situation and the seriousness of this injury.  Earleen Newport, MD 07/25/2019, 10:06 PM

## 2019-07-25 NOTE — ED Provider Notes (Signed)
[facility] Provider Note   CSN: [id number] Arrival date & time: 07/25/19  1530     History Chief Complaint  Patient presents with  . Fall    [facility] is a 54 y.o. female.  54 year old female here after mechanical fall just prior to arrival.  Struck her head and had a brief loss of consciousness.  Complains of pain to the left parietal temporal region.  Denies any neck pain.  No focal weakness in upper or lower extremities.  Has had nausea but no vomiting.  Denies any visual changes.  Was able to ambulate after the fall.        Past Medical History:  Diagnosis Date  . Hematuria   . Hyperlipidemia   . Hypothyroidism   . Keratosis pilaris   . Vitamin D deficiency     Patient Active Problem List   Diagnosis Date Noted  . Dyslipidemia 10/18/2014  . Keratosis pilaris 10/18/2014  . Subclinical hypothyroidism 10/18/2014  . Vitamin D deficiency 10/18/2014  . History of Hashimoto thyroiditis 03/18/2013    Past Surgical History:  Procedure Laterality Date  . CESAREAN SECTION  1991  . CESAREAN SECTION  1992  . COLONOSCOPY WITH PROPOFOL N/A 04/19/2016   Procedure: COLONOSCOPY WITH PROPOFOL;  Surgeon: Lollie Sails, MD;  Location: [facility] ENDOSCOPY;  Service: Endoscopy;  Laterality: N/A;     OB History   No obstetric history on file.     Family History  Problem Relation Age of Onset  . Heart disease Father   . Cancer Maternal Grandmother        Stomach    Social History   Tobacco Use  . Smoking status: Never Smoker  . Smokeless tobacco: Never Used  Substance Use Topics  . Alcohol use: Yes    Alcohol/week: 0.0 standard drinks    Comment: occasionally  . Drug use: No    Home Medications Prior to Admission medications   Medication Sig Start Date End Date Taking? Authorizing Provider  cholecalciferol (VITAMIN D) 1000 UNITS tablet Take 2,000 Units by mouth daily.    [provider]  omega-3 acid  ethyl esters (LOVAZA) 1 g capsule Take 2 capsules (2 g total) by mouth 2 (two) times daily. 11/07/18   Steele Sizer, MD    Allergies    Patient has no known allergies.  Review of Systems   Review of Systems  All other systems reviewed and are negative.   Physical Exam Updated Vital Signs BP (!) 146/84   Pulse 60   Temp 98.7 F (37.1 C) (Oral)   Resp 16   Wt 68 kg   SpO2 100%   BMI 25.35 kg/m   Physical Exam Vitals and nursing note reviewed.  Constitutional:      General: She is not in acute distress.    Appearance: Normal appearance. She is well-developed. She is not toxic-appearing.  HENT:     Head: Normocephalic and atraumatic.  Eyes:     General: Lids are normal.     Conjunctiva/sclera: Conjunctivae normal.     Pupils: Pupils are equal, round, and reactive to light.  Neck:     Thyroid: No thyroid mass.     Trachea: No tracheal deviation.  Cardiovascular:     Rate and Rhythm: Normal rate and regular rhythm.     Heart sounds: Normal heart sounds. No murmur. No gallop.   Pulmonary:     Effort: Pulmonary effort is normal. No respiratory distress.  Breath sounds: Normal breath sounds. No stridor. No decreased breath sounds, wheezing, rhonchi or rales.  Abdominal:     General: Bowel sounds are normal. There is no distension.     Palpations: Abdomen is soft.     Tenderness: There is no abdominal tenderness. There is no rebound.  Musculoskeletal:        General: No tenderness. Normal range of motion.     Cervical back: Full passive range of motion without pain, normal range of motion and neck supple.  Skin:    General: Skin is warm and dry.     Findings: No abrasion or rash.  Neurological:     General: No focal deficit present.     Mental Status: She is alert and oriented to person, place, and time.     GCS: GCS eye subscore is 4. GCS verbal subscore is 5. GCS motor subscore is 6.     Cranial Nerves: No cranial nerve deficit.     Sensory: No sensory deficit.    Psychiatric:        Speech: Speech normal.        Behavior: Behavior normal.     ED Results / Procedures / Treatments   Labs (all labs ordered are listed, but only abnormal results are displayed) Labs Reviewed - No data to display  EKG EKG Interpretation  Date/Time:  Thursday July 25 2019 20:33:06 EDT Ventricular Rate:  67 PR Interval:    QRS Duration: 80 QT Interval:  404 QTC Calculation: 427 R Axis:   79 Text Interpretation: Sinus rhythm No old tracing to compare Confirmed by Lacretia Leigh ([id number]) on 07/25/2019 8:47:52 PM   Radiology No results found.  Procedures Procedures (including critical care time)  Medications Ordered in ED Medications  HYDROcodone-acetaminophen (NORCO/VICODIN) 5-325 MG per tablet 1 tablet (has no administration in time range)    ED Course  I have reviewed the triage vital signs and the nursing notes.  Pertinent labs & imaging results that were available during my care of the patient were reviewed by me and considered in my medical decision making (see chart for details).    MDM Rules/Calculators/A&P                     Patient CT of her C-spine was without acute fracture.  CT of the head shows fracture of the temporal bone with large intraparenchymal contusion with subarachnoid blood.  Discussed with Dr. Ellene Route from neurosurgery who will come to admit patient treated with Vicodin at first followed by fentanyl for her pain.   CRITICAL CARE Performed by: Leota Jacobsen Total critical care time: 40 minutes Critical care time was exclusive of separately billable procedures and treating other patients. Critical care was necessary to treat or prevent imminent or life-threatening deterioration. Critical care was time spent personally by me on the following activities: development of treatment plan with patient and/or surrogate as well as nursing, discussions with consultants, evaluation of patient's response to treatment, examination of  patient, obtaining history from patient or surrogate, ordering and performing treatments and interventions, ordering and review of laboratory studies, ordering and review of radiographic studies, pulse oximetry and re-evaluation of patient's condition.  Final Clinical Impression(s) / ED Diagnoses Final diagnoses:  None    Rx / DC Orders ED Discharge Orders    None       Lacretia Leigh, MD 07/25/19 2053

## 2019-07-25 NOTE — ED Notes (Signed)
Neuro Surgeon just left room after assessment

## 2019-07-25 NOTE — ED Notes (Signed)
Husband notified of room assignment

## 2019-07-25 NOTE — ED Notes (Signed)
Pt vomited 600cc.

## 2019-07-25 NOTE — ED Notes (Addendum)
Husband asked to be contacted upon room assignment. His name is Charm and he can be reached at [phone]. Husband gave second number of [phone]

## 2019-07-25 NOTE — ED Notes (Signed)
Pt to and from CT via cart. 

## 2019-07-26 ENCOUNTER — Encounter (HOSPITAL_COMMUNITY): Payer: Self-pay | Admitting: Neurological Surgery

## 2019-07-26 ENCOUNTER — Observation Stay (HOSPITAL_COMMUNITY): Payer: PRIVATE HEALTH INSURANCE

## 2019-07-26 DIAGNOSIS — R4701 Aphasia: Secondary | ICD-10-CM | POA: Diagnosis present

## 2019-07-26 DIAGNOSIS — E559 Vitamin D deficiency, unspecified: Secondary | ICD-10-CM | POA: Diagnosis present

## 2019-07-26 DIAGNOSIS — R739 Hyperglycemia, unspecified: Secondary | ICD-10-CM | POA: Diagnosis present

## 2019-07-26 DIAGNOSIS — S06351D Traumatic hemorrhage of left cerebrum with loss of consciousness of 30 minutes or less, subsequent encounter: Secondary | ICD-10-CM

## 2019-07-26 DIAGNOSIS — Y92219 Unspecified school as the place of occurrence of the external cause: Secondary | ICD-10-CM | POA: Diagnosis not present

## 2019-07-26 DIAGNOSIS — R402362 Coma scale, best motor response, obeys commands, at arrival to emergency department: Secondary | ICD-10-CM | POA: Diagnosis present

## 2019-07-26 DIAGNOSIS — Y990 Civilian activity done for income or pay: Secondary | ICD-10-CM | POA: Diagnosis not present

## 2019-07-26 DIAGNOSIS — E785 Hyperlipidemia, unspecified: Secondary | ICD-10-CM

## 2019-07-26 DIAGNOSIS — S065X0A Traumatic subdural hemorrhage without loss of consciousness, initial encounter: Secondary | ICD-10-CM | POA: Diagnosis present

## 2019-07-26 DIAGNOSIS — W010XXA Fall on same level from slipping, tripping and stumbling without subsequent striking against object, initial encounter: Secondary | ICD-10-CM | POA: Diagnosis present

## 2019-07-26 DIAGNOSIS — E039 Hypothyroidism, unspecified: Secondary | ICD-10-CM

## 2019-07-26 DIAGNOSIS — Z20822 Contact with and (suspected) exposure to covid-19: Secondary | ICD-10-CM | POA: Diagnosis present

## 2019-07-26 DIAGNOSIS — S065X9A Traumatic subdural hemorrhage with loss of consciousness of unspecified duration, initial encounter: Secondary | ICD-10-CM | POA: Diagnosis present

## 2019-07-26 DIAGNOSIS — Z8249 Family history of ischemic heart disease and other diseases of the circulatory system: Secondary | ICD-10-CM | POA: Diagnosis not present

## 2019-07-26 DIAGNOSIS — R402252 Coma scale, best verbal response, oriented, at arrival to emergency department: Secondary | ICD-10-CM | POA: Diagnosis present

## 2019-07-26 DIAGNOSIS — E038 Other specified hypothyroidism: Secondary | ICD-10-CM | POA: Diagnosis present

## 2019-07-26 DIAGNOSIS — E781 Pure hyperglyceridemia: Secondary | ICD-10-CM | POA: Diagnosis present

## 2019-07-26 DIAGNOSIS — Y9301 Activity, walking, marching and hiking: Secondary | ICD-10-CM | POA: Diagnosis present

## 2019-07-26 DIAGNOSIS — S066X9A Traumatic subarachnoid hemorrhage with loss of consciousness of unspecified duration, initial encounter: Secondary | ICD-10-CM | POA: Diagnosis present

## 2019-07-26 DIAGNOSIS — S0219XA Other fracture of base of skull, initial encounter for closed fracture: Secondary | ICD-10-CM | POA: Diagnosis present

## 2019-07-26 DIAGNOSIS — R402142 Coma scale, eyes open, spontaneous, at arrival to emergency department: Secondary | ICD-10-CM | POA: Diagnosis present

## 2019-07-26 DIAGNOSIS — S061X9A Traumatic cerebral edema with loss of consciousness of unspecified duration, initial encounter: Secondary | ICD-10-CM | POA: Diagnosis present

## 2019-07-26 LAB — MRSA PCR SCREENING: MRSA by PCR: NEGATIVE

## 2019-07-26 LAB — HIV ANTIBODY (ROUTINE TESTING W REFLEX): HIV Screen 4th Generation wRfx: NONREACTIVE

## 2019-07-26 LAB — TSH: TSH: 1.114 u[IU]/mL (ref 0.350–4.500)

## 2019-07-26 MED ORDER — DEXAMETHASONE 4 MG PO TABS
2.0000 mg | ORAL_TABLET | Freq: Once | ORAL | Status: AC
  Administered 2019-07-26: 2 mg via ORAL
  Filled 2019-07-26: qty 1

## 2019-07-26 MED ORDER — OXYCODONE HCL 5 MG PO TABS
5.0000 mg | ORAL_TABLET | Freq: Four times a day (QID) | ORAL | Status: DC | PRN
  Administered 2019-07-27 – 2019-07-28 (×5): 5 mg via ORAL
  Filled 2019-07-26 (×6): qty 1

## 2019-07-26 MED ORDER — DEXAMETHASONE 4 MG PO TABS
2.0000 mg | ORAL_TABLET | Freq: Two times a day (BID) | ORAL | Status: DC
  Administered 2019-07-26 – 2019-07-29 (×6): 2 mg via ORAL
  Filled 2019-07-26 (×6): qty 1

## 2019-07-26 MED ORDER — ONDANSETRON HCL 4 MG/2ML IJ SOLN: 4.0000 mg | Freq: Four times a day (QID) | INTRAMUSCULAR | Status: DC | PRN

## 2019-07-26 MED ORDER — CHLORHEXIDINE GLUCONATE CLOTH 2 % EX PADS
6.0000 | MEDICATED_PAD | Freq: Every day | CUTANEOUS | Status: DC
  Administered 2019-07-26 – 2019-07-27 (×2): 6 via TOPICAL

## 2019-07-26 MED ORDER — ACETAMINOPHEN 325 MG PO TABS
650.0000 mg | ORAL_TABLET | ORAL | Status: DC | PRN
  Administered 2019-07-26 – 2019-07-29 (×10): 650 mg via ORAL
  Filled 2019-07-26 (×9): qty 2

## 2019-07-26 NOTE — Progress Notes (Signed)
Patient and patient's husband would like treatment team to discuss with Dr. Tera Partridge about treatment plan and verbally agree to let Dr. Tera Partridge access to patient's chart.

## 2019-07-26 NOTE — Consult Note (Signed)
Medical Consultation   Autumn Long  [id number]  DOB: 1965-08-28  DOA: 07/25/2019  PCP: Steele Sizer, MD   Outpatient Specialists: None   Requesting physician: Ellene Route  Reason for consultation: Walking backwards and tripped and fell, contusion of L temporal lobe.  Now there is concern that there was syncope involved.  Does she need a syncope evaluation?   History of Present Illness: Autumn Long is an 54 y.o. female with h/o hypertriglyceridemia and hypothyroidism (not on home medications) presenting with a fall on 3/25.  She was admitted to the neurosurgery service due to L temporal contusion with comminuted temporal bone fracture and thin SDH with mild amount of shift.  She was observed in the ICU with plan for repeat CT this AM and ST evaluation.   Repeat CT without change in the size of the SDH but mild increase in the vasogenic edema and stable 3 mm midline shift.  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  She turned and slipped.  She did not feel dizzy before she fell.  She was fine before the fall, running and playing with the kids.   Review of Systems:  ROS As per HPI otherwise 10 point review of systems negative.    Past Medical History: Past Medical History:  Diagnosis Date  . Hematuria   . Hyperlipidemia   . Hypothyroidism   . Keratosis pilaris   . Vitamin D deficiency     Past Surgical History: Past Surgical History:  Procedure Laterality Date  . CESAREAN SECTION  1991  . CESAREAN SECTION  1992  . COLONOSCOPY WITH PROPOFOL N/A 04/19/2016   Procedure: COLONOSCOPY WITH PROPOFOL;  Surgeon: Lollie Sails, MD;  Location: [facility] ENDOSCOPY;  Service: Endoscopy;  Laterality: N/A;     Allergies:  No Known Allergies   Social History:  reports that she has never smoked. She has never used smokeless tobacco. She reports current alcohol use. She reports that she does not use drugs.   Family  History: Family History  Problem Relation Age of Onset  . Heart disease Father   . Cancer Maternal Grandmother        Stomach      Physical Exam: Vitals:   07/26/19 1500 07/26/19 1600 07/26/19 1700 07/26/19 1800  BP: 113/62 129/72 122/61 120/62  Pulse: 71 70 66 75  Resp: 18 (!) 22 19 20   Temp:  (!) 100.7 F (38.2 C)    TempSrc:  Oral    SpO2: 98% 97% 98% 98%  Weight:      Height:        Constitutional: Alert and awake, oriented x3, not in any acute distress.  She became somnolent during the encounter.  Mild L temporal fullness and facial edema Eyes: EOMI, irises appear normal, anicteric sclera,  ENMT: external ears and nose appear normal, normal hearing, Lips appear normal; apparent left-sided hearing loss Neck: neck appears normal, no masses, normal ROM, no thyromegaly, no JVD  CVS: S1-S2 clear, no murmur rubs or gallops, no LE edema, normal pedal pulses  Respiratory:  clear to auscultation bilaterally, no wheezing, rales or rhonchi. Respiratory effort normal. No accessory muscle use.  Abdomen: soft nontender, nondistended, normal bowel sounds, no hepatosplenomegaly, no hernias  Musculoskeletal: : no cyanosis, clubbing or edema noted bilaterally Neuro: Cranial nerves II-XII intact, strength, sensation, reflexes Psych:   Apparent cognitive impairment with mild expressive  aphasia, repetitive speech patterns Skin: no rashes or lesions or ulcers, no induration or nodules    Data reviewed:  I have personally reviewed the recent labs and imaging studies  Pertinent Labs:   Glucose 139 Normal CBC INR 1.0 Respiratory panel PCR negative   Inpatient Medications:   Scheduled Meds: . Chlorhexidine Gluconate Cloth  6 each Topical Daily   Continuous Infusions: . sodium chloride Stopped (07/26/19 0143)     Radiological Exams on Admission: CT HEAD WO CONTRAST  Result Date: 07/26/2019 CLINICAL DATA:  Follow-up head trauma EXAM: CT HEAD WITHOUT CONTRAST TECHNIQUE: Contiguous  axial images were obtained from the base of the skull through the vertex without intravenous contrast. COMPARISON:  Yesterday FINDINGS: Brain: Hemorrhagic contusion in the anterior and inferior left temporal lobe with hemorrhagic area measuring up to 4 cm anterior to posterior and 2.8 cm in transverse span. When remeasured in a similar fashion there is no interval change in the hemorrhagic component or shape. There is a rim of vasogenic edema that is mildly increased. Generalized subarachnoid hemorrhage along the left cerebral convexity and at the superior vermian cistern. Minimal subarachnoid hemorrhage at the low right sylvian fissure. Midline shift measures 3 mm today. No hydrocephalus or visible infarct Vascular: Negative Skull: Left longitudinal temporal bone fracture with mastoid and middle ear hemorrhage. No new osseous finding. Sinuses/Orbits: Negative IMPRESSION: 1. Left temporal lobe contusion with subarachnoid hemorrhage without interval increased. There has been mild increase in the vasogenic edema - midline shift measures 3 mm. 2. Known left temporal bone fracture. Electronically Signed   By: Monte Fantasia M.D.   On: 07/26/2019 05:39   CT Head Wo Contrast  Result Date: 07/25/2019 CLINICAL DATA:  Fall with head trauma EXAM: CT HEAD WITHOUT CONTRAST TECHNIQUE: Contiguous axial images were obtained from the base of the skull through the vertex without intravenous contrast. COMPARISON:  None. FINDINGS: Brain: Intraparenchymal contusion within the anterior left temporal lobe measures approximately 3.9 x 2.3 x 2.8 cm (volume = 13 cm^3). There is a large amount of subarachnoid blood over the left convexity. There is slight rightward midline shift, which measures 3 mm. No hydrocephalus. Vascular: No hyperdense vessel or unexpected calcification. Skull: There is a fracture through the petrous portion of the left temporal bone that traverses the anterior mastoid air cells. There is blood within the  dependent portion of the left middle ear and within the mastoid. The fracture does not appear to involve the otic capsule. Sinuses/Orbits: No acute finding. Other: None. IMPRESSION: 1. Left temporal lobe intraparenchymal contusion with large amount of subarachnoid blood over the left convexity and slight rightward midline shift. 2. Fracture of the petrous portion of the left temporal bone with blood within the left middle ear and mastoid. No visualized otic capsule involvement. Critical Value/emergent results were called by telephone at the time of interpretation on 07/25/2019 at 8:04 pm to provider [facility] , who verbally acknowledged these results. Electronically Signed   By: Ulyses Jarred M.D.   On: 07/25/2019 20:04   CT Cervical Spine Wo Contrast  Result Date: 07/25/2019 CLINICAL DATA:  Status post fall. EXAM: CT CERVICAL SPINE WITHOUT CONTRAST TECHNIQUE: Multidetector CT imaging of the cervical spine was performed without intravenous contrast. Multiplanar CT image reconstructions were also generated. COMPARISON:  None. FINDINGS: Alignment: Normal. Skull base and vertebrae: No acute fracture is seen involving the cervical spine. A comminuted nondisplaced fracture is seen involving the temporal bones on the left. This extends through the mastoid air cells  to the posterior aspect of the left temporomandibular joint. Soft tissues and spinal canal: No prevertebral fluid or swelling. No visible canal hematoma. Disc levels: Normal cervical spine endplates are seen with normal multilevel intervertebral disc spaces. Upper chest: Negative. Other: A mild amount of soft tissue air is seen along the anterior aspect of the temporal bones on the left. IMPRESSION: 1. Comminuted nondisplaced fracture involving the left temporal bone. MRI correlation is recommended. 2. No acute fracture or subluxation is seen involving the cervical spine. Electronically Signed   By: Virgina Norfolk M.D.   On: 07/25/2019 20:11     Impression/Recommendations Principal Problem:   Closed head injury with petechial hemorrhage of left cerebrum (HCC) Active Problems:   Dyslipidemia   Subclinical hypothyroidism  Closed head injury s/p fall -Patient with a mechanical fall resulting in left temporal lobe intraparenchymal contusion with large amount of subarachnoid blood over the left convexity and slight rightward midline shift; also with left temporal bone comminuted fracture -There was some question about whether this fall was preceded by syncope, but in discussion with the patient and her husband this does not appear to be the case - 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 -Unfortunately, this was a significant fall with significant injury that may take time to recover and could cause long-term sequelae -ST was consulted for expressive > receptive aphasia and has recommended ongoing outpatient ST -MRI could be considered for further evaluation, but stability was demonstrated on CT today and so this may not be needed; will defer to neurosurgery -Will add oxycodone to Tylenol for pain management as needed -Ongoing management per neurosurgery; TRH will sign off at this time  Hypothyroidism -Normal TSH in 10/2018; will repeat -She is not taking medications for this issue at this time  Dyslipidemia -Lipids were last checked in 10/2018 -LDL 97, TG minimally elevated at 154 -Continue outpatient PCP management  Hyperglycemia -May be stress response -Prior A1c was 5.3 in 10/2018 -Will follow with fasting AM labs   Thank you for this consultation.  [facility] hospitalist team will sign off at this time.   Time Spent: 49 minutes  Karmen Bongo M.D. [facility] 07/26/2019, 6:04 PM

## 2019-07-26 NOTE — Progress Notes (Signed)
PIV consult: R arm and L forearm assessed with ultrasound. Veins too small for 20g 1.88 inch needle. Please consider PICC if pt requires prolonged IV medications.

## 2019-07-26 NOTE — Progress Notes (Signed)
Patient ID: Autumn Long, female   DOB: April 11, 1966, 54 y.o.   MRN: [id number] Vital signs have been stable Motor function remains symmetric in the upper extremities though right left confusion and right-sided neglect seems to continue.  Speech therapy has evaluated the patient notes that she will require outpatient speech and language pathology to continue with rehab At this time her level of consciousness seems somewhat depressed she is sleeping mostly but when aroused she complains of pain on the left side of her head she was started on some Decadron and this did seem to help with some of the pain  I reviewed the CT scan with the patient's husband and explained to him the nature of the in size of the contusion that is present and the fact that there is a little bit more vasogenic edema.  I believe she needs to have continued observation in the hospital  We will continue the Decadron at low-dose 2 mg twice a day I will discussed the situation with Dr. Arnoldo Morale who is on-call this weekend it is likely that she may need to spend the next few days here in the hospital I explained this to the patient's husband the patient herself is indicating that she would like to go home tomorrow however I am not sure that that safe given the nature of this contusion and the size of the area involved in her temporal lobe.  Will follow along conservatively.

## 2019-07-26 NOTE — Evaluation (Addendum)
Speech Language Pathology Evaluation Patient Details Name: Autumn Long MRN: [id number] DOB: [birth year] Today's Date: 07/26/2019 Time: [id number] SLP Time Calculation (min) (ACUTE ONLY): 44 min  Problem List:  Patient Active Problem List   Diagnosis Date Noted  . Closed head injury with petechial hemorrhage of left cerebrum ([place]) 07/25/2019  . Dyslipidemia 10/18/2014  . Keratosis pilaris 10/18/2014  . Subclinical hypothyroidism 10/18/2014  . Vitamin D deficiency 10/18/2014  . History of Hashimoto thyroiditis 03/18/2013   Past Medical History:  Past Medical History:  Diagnosis Date  . Hematuria   . Hyperlipidemia   . Hypothyroidism   . Keratosis pilaris   . Vitamin D deficiency    Past Surgical History:  Past Surgical History:  Procedure Laterality Date  . CESAREAN SECTION  1991  . CESAREAN SECTION  1992  . COLONOSCOPY WITH PROPOFOL N/A 04/19/2016   Procedure: COLONOSCOPY WITH PROPOFOL;  Surgeon: Lollie Sails, MD;  Location: [facility] ENDOSCOPY;  Service: Endoscopy;  Laterality: N/A;   HPI:  Pt is a 54 yo female admitted s/p fall while walking backwards at work, hitting her L parieto-occipital region (no documented LOC). CT revealed L temporal bone fx and large L temporal contusion with SAH and mild midline shift. PMH includes: Hematuria, Hyperlipidemia, Hypothyroidism, Keratosis pilaris, Vitamin D deficiency   Assessment / Plan / Recommendation Clinical Impression  Pt has an expressive > receptive aphasia, with communication also impacted at the moment by her hearing difficulties. When provided with written questions and instructions pt had improved accuracy compared to when given audible stimuli. She still had only mild errors in comprehension with yes/no questions as they became more abstract. With written instructions she needed Min-Mod visual cues and prompts to look at key words in the sentence to facilitate accuracy. Her expressive language while fluent is  characterized by word-finding errors and phonetic paraphasias. Letter substitutions are also noted in her own writing. She correctly identified one familiar object during confrontational naming trials, but written sentence-completion cues increased her accuracy to 100%. She also spontaneously tries to use circumlocation or gestures. Education was provided to pt and family regarding current level of function and recommendation for SLP f/u acutely as well as post-discharge. We also discussed cueing methods and techniques that may facilitate her language skills.     SLP Assessment  SLP Recommendation/Assessment: Patient needs continued Speech Lanaguage Pathology Services SLP Visit Diagnosis: Aphasia (R47.01)    Follow Up Recommendations  Outpatient SLP    Frequency and Duration min 2x/week  2 weeks      SLP Evaluation Cognition  Overall Cognitive Status: Impaired/Different from baseline(aphasia) Arousal/Alertness: Awake/alert Orientation Level: Oriented to person;Oriented to place;Oriented to time       Comprehension  Auditory Comprehension Overall Auditory Comprehension: Impaired Yes/No Questions: Impaired Basic Biographical Questions: 76-100% accurate Basic Immediate Environment Questions: 75-100% accurate Complex Questions: 50-74% accurate Commands: Impaired One Step Basic Commands: 75-100% accurate Interfering Components: Hearing EffectiveTechniques: Other (Comment)(writing, pointing to key words) Reading Comprehension Reading Status: Impaired Sentence Level: Impaired Interfering Components: Impulsivity Effective Techniques: Visual cueing    Expression Expression Primary Mode of Expression: Verbal Verbal Expression Overall Verbal Expression: Impaired Initiation: No impairment Automatic Speech: Name;Social Response Level of Generative/Spontaneous Verbalization: Sentence Naming: Impairment Confrontation: Impaired Verbal Errors: Not aware of errors;Other  (comment);Perseveration(circumlocution) Non-Verbal Means of Communication: Not applicable Written Expression Written Expression: Exceptions to [facility] Self Formulation Ability: Word   Oral / Motor  Motor Speech Overall Motor Speech: Appears within functional limits for tasks assessed   GO  Osie Bond., M.A. [facility] Pager [phone] Office [phone]  07/26/2019, 11:04 AM

## 2019-07-27 ENCOUNTER — Encounter (HOSPITAL_COMMUNITY): Payer: Self-pay | Admitting: Neurological Surgery

## 2019-07-27 MED ORDER — WHITE PETROLATUM EX OINT
TOPICAL_OINTMENT | CUTANEOUS | Status: AC
  Filled 2019-07-27: qty 28.35

## 2019-07-27 MED ORDER — WHITE PETROLATUM EX OINT: TOPICAL_OINTMENT | CUTANEOUS | Status: DC | PRN

## 2019-07-27 NOTE — Progress Notes (Signed)
Subjective: The patient is alert and pleasant.  Her husband is at the bedside.  She feels much better.  Objective: Vital signs in last 24 hours: Temp:  [97.9 F (36.6 C)-100.7 F (38.2 C)] 97.9 F (36.6 C) (03/27 0800) Pulse Rate:  [54-82] 62 (03/27 0800) Resp:  [15-24] 18 (03/27 0800) BP: (91-137)/(55-95) 111/78 (03/27 0800) SpO2:  [96 %-99 %] 96 % (03/27 0800) Estimated body mass index is 25.3 kg/m as calculated from the following:   Height as of this encounter: 5' 4.57" (1.64 m).   Weight as of this encounter: 68 kg.   Intake/Output from previous day: No intake/output data recorded. Intake/Output this shift: No intake/output data recorded.  Physical exam the patient is alert and oriented x3.  Her speech is normal.  She is moving all 4 extremities well.  Her pupils are equal.  Lab Results: Recent Labs    07/25/19 2038  WBC 10.5  HGB 14.2  HCT 43.5  PLT 186   BMET Recent Labs    07/25/19 2038  NA 137  K 4.2  CL 102  CO2 25  GLUCOSE 139*  BUN 10  CREATININE 0.60  CALCIUM 9.5    Studies/Results: CT HEAD WO CONTRAST  Result Date: 07/26/2019 CLINICAL DATA:  Follow-up head trauma EXAM: CT HEAD WITHOUT CONTRAST TECHNIQUE: Contiguous axial images were obtained from the base of the skull through the vertex without intravenous contrast. COMPARISON:  Yesterday FINDINGS: Brain: Hemorrhagic contusion in the anterior and inferior left temporal lobe with hemorrhagic area measuring up to 4 cm anterior to posterior and 2.8 cm in transverse span. When remeasured in a similar fashion there is no interval change in the hemorrhagic component or shape. There is a rim of vasogenic edema that is mildly increased. Generalized subarachnoid hemorrhage along the left cerebral convexity and at the superior vermian cistern. Minimal subarachnoid hemorrhage at the low right sylvian fissure. Midline shift measures 3 mm today. No hydrocephalus or visible infarct Vascular: Negative Skull: Left  longitudinal temporal bone fracture with mastoid and middle ear hemorrhage. No new osseous finding. Sinuses/Orbits: Negative IMPRESSION: 1. Left temporal lobe contusion with subarachnoid hemorrhage without interval increased. There has been mild increase in the vasogenic edema - midline shift measures 3 mm. 2. Known left temporal bone fracture. Electronically Signed   By: Monte Fantasia M.D.   On: 07/26/2019 05:39   CT Head Wo Contrast  Result Date: 07/25/2019 CLINICAL DATA:  Fall with head trauma EXAM: CT HEAD WITHOUT CONTRAST TECHNIQUE: Contiguous axial images were obtained from the base of the skull through the vertex without intravenous contrast. COMPARISON:  None. FINDINGS: Brain: Intraparenchymal contusion within the anterior left temporal lobe measures approximately 3.9 x 2.3 x 2.8 cm (volume = 13 cm^3). There is a large amount of subarachnoid blood over the left convexity. There is slight rightward midline shift, which measures 3 mm. No hydrocephalus. Vascular: No hyperdense vessel or unexpected calcification. Skull: There is a fracture through the petrous portion of the left temporal bone that traverses the anterior mastoid air cells. There is blood within the dependent portion of the left middle ear and within the mastoid. The fracture does not appear to involve the otic capsule. Sinuses/Orbits: No acute finding. Other: None. IMPRESSION: 1. Left temporal lobe intraparenchymal contusion with large amount of subarachnoid blood over the left convexity and slight rightward midline shift. 2. Fracture of the petrous portion of the left temporal bone with blood within the left middle ear and mastoid. No visualized otic capsule  involvement. Critical Value/emergent results were called by telephone at the time of interpretation on 07/25/2019 at 8:04 pm to provider [facility] , who verbally acknowledged these results. Electronically Signed   By: Ulyses Jarred M.D.   On: 07/25/2019 20:04   CT Cervical Spine  Wo Contrast  Result Date: 07/25/2019 CLINICAL DATA:  Status post fall. EXAM: CT CERVICAL SPINE WITHOUT CONTRAST TECHNIQUE: Multidetector CT imaging of the cervical spine was performed without intravenous contrast. Multiplanar CT image reconstructions were also generated. COMPARISON:  None. FINDINGS: Alignment: Normal. Skull base and vertebrae: No acute fracture is seen involving the cervical spine. A comminuted nondisplaced fracture is seen involving the temporal bones on the left. This extends through the mastoid air cells to the posterior aspect of the left temporomandibular joint. Soft tissues and spinal canal: No prevertebral fluid or swelling. No visible canal hematoma. Disc levels: Normal cervical spine endplates are seen with normal multilevel intervertebral disc spaces. Upper chest: Negative. Other: A mild amount of soft tissue air is seen along the anterior aspect of the temporal bones on the left. IMPRESSION: 1. Comminuted nondisplaced fracture involving the left temporal bone. MRI correlation is recommended. 2. No acute fracture or subluxation is seen involving the cervical spine. Electronically Signed   By: Virgina Norfolk M.D.   On: 07/25/2019 20:11    Assessment/Plan: Left temporal contusion, skull fracture: The patient is doing well clinically.  We will need to observe her for a couple more days because of the size and location of her hemorrhage.  I have explained this to the patient and her husband.  I have answered all their questions.  LOS: 1 day     Autumn Long 07/27/2019, 9:21 AM

## 2019-07-28 NOTE — Progress Notes (Signed)
Subjective: The patient is alert and pleasant.  Her husband is at the bedside.  She wants to go home.  Objective: Vital signs in last 24 hours: Temp:  [97.7 F (36.5 C)-99.6 F (37.6 C)] 98.9 F (37.2 C) (03/28 1133) Pulse Rate:  [48-68] 55 (03/28 1130) Resp:  [2-26] 15 (03/28 1130) BP: (108-137)/(53-76) 108/64 (03/28 1130) SpO2:  [95 %-100 %] 96 % (03/28 1133) Estimated body mass index is 25.3 kg/m as calculated from the following:   Height as of this encounter: 5' 4.57" (1.64 m).   Weight as of this encounter: 68 kg.   Intake/Output from previous day: 03/27 0701 - 03/28 0700 In: 70 [P.O.:50] Out: -  Intake/Output this shift: Total I/O In: 480 [P.O.:480] Out: -   Physical exam the patient is alert and oriented.  Her speech is normal, although she does tend to repeat herself.  Her strength is normal.  Lab Results: Recent Labs    07/25/19 2038  WBC 10.5  HGB 14.2  HCT 43.5  PLT 186   BMET Recent Labs    07/25/19 2038  NA 137  K 4.2  CL 102  CO2 25  GLUCOSE 139*  BUN 10  CREATININE 0.60  CALCIUM 9.5    Studies/Results: No results found.  Assessment/Plan: Left temporal hematoma/contusion: The patient continues to do well clinically 3 days after her injury.  We will plan to repeat her CAT scan tomorrow.  I have answered all her questions.  LOS: 2 days     Autumn Long 07/28/2019, 12:54 PM

## 2019-07-29 ENCOUNTER — Inpatient Hospital Stay (HOSPITAL_COMMUNITY): Payer: PRIVATE HEALTH INSURANCE

## 2019-07-29 MED ORDER — HYDROCODONE-ACETAMINOPHEN 5-325 MG PO TABS: 1.0000 | ORAL_TABLET | ORAL | 0 refills | Status: DC | PRN

## 2019-07-29 MED ORDER — DEXAMETHASONE 1 MG PO TABS: ORAL_TABLET | ORAL | 0 refills | Status: DC

## 2019-07-29 NOTE — Plan of Care (Signed)
Patient is scheduled for speech outpatient and follow with doctor in 3 weeks. Patient is doing well and going home with husband

## 2019-07-29 NOTE — TOC Transition Note (Signed)
Transition of Care (TOC) - CM/SW Discharge Note Marvetta Gibbons RN,BSN Transitions of Care Unit 4NP (non trauma) - RN Case Manager [phone]   Patient Details  Name: Autumn Long MRN: [id number] Date of Birth: August 22, 1965  Transition of Care [facility] CM/SW Contact:  Dawayne Patricia, RN Phone Number: 07/29/2019, 3:40 PM   Clinical Narrative:    Notified by bedside RN that pt was stable for transition home today and would need HH- CM in to speak with pt and spouse at bedside- per conversation was told that MD only wanted ST for home- explained to pt and spouse that [facility] would not come out for just ST at home- offered outpt rehab- spouse noted they would f/u with MD and do what he recommended. Spoke with Dr. Ellene Route to explain about [facility]- per Dr. Ellene Route pt will need outpt ST- verbal given to refer pt to outpt neuro rehab for ST. - Referral sent via epic to [facility] neuro rehab for ST.    Final next level of care: OP Rehab Barriers to Discharge: No Barriers Identified   Patient Goals and CMS Choice Patient states their goals for this hospitalization and ongoing recovery are:: home CMS Medicare.gov Compare Post Acute Care list provided to:: Patient Choice offered to / list presented to : Spouse  Discharge Placement               Home        Discharge Plan and Services   Discharge Planning Services: CM Consult Post Acute Care Choice: Home Health          DME Arranged: N/A           HH Agency: NA        Social Determinants of Health (SDOH) Interventions     Readmission Risk Interventions No flowsheet data found.

## 2019-07-29 NOTE — Discharge Summary (Signed)
Physician Discharge Summary  Patient ID: Autumn Long MRN: [id number] DOB/AGE: 09-05-1965 54 y.o.  Admit date: 07/25/2019 Discharge date: 07/29/2019  Admission Diagnoses: Traumatic cerebral contusion left temporal pole, traumatic subarachnoid hemorrhage.  Basilar skull fracture left temporal bone.  Receptive and motor aphasia.  Discharge Diagnoses: Traumatic cerebral contusion left temporal pole.  Traumatic subarachnoid hemorrhage.  Basilar skull fracture left temporal bone.  Receptive and motor aphasia. Principal Problem:   Closed head injury with petechial hemorrhage of left cerebrum (HCC) Active Problems:   Dyslipidemia   Subclinical hypothyroidism   Discharged Condition: good  Hospital Course: Patient was admitted to undergo observation for a rather profound closed head injury with a left temporal contusion and traumatic subarachnoid hemorrhage.  Subsequent scans showed that the hemorrhage had coalesced some and the patient was having significant speech difficulty.  She was observed in the hospital for a period of 72 hours and was started on some low-dose Decadron.  Her sensorium has improved though she still has some mild to moderate speech deficit.  She is discharged home to undergo outpatient speech therapy.  She is to remain out of the workplace likely for a total of 3 months until her speech function recovers.  She will be seen on an outpatient basis in 3 weeks time.  Consults: Speech therapy  Significant Diagnostic Studies: CT scans on 325 326 and 329.  Treatments: Observation, speech therapy  Discharge Exam: Blood pressure 130/71, pulse (!) 59, temperature 98.2 F (36.8 C), temperature source Oral, resp. rate 20, height 5' 4.57" (1.64 m), weight 68 kg, SpO2 99 %. Patient is awake and alert face is symmetric there is no evidence of a cortical drift.  Speech demonstrates some mild receptive aphasia and a mild production aphasia with occasional grammatical errors.  Station  and gait are intact.  Disposition: Discharge disposition: 01-Home or Self Care       Discharge Instructions    Call MD for:  persistant nausea and vomiting   Complete by: As directed    Call MD for:  severe uncontrolled pain   Complete by: As directed    Call MD for:  temperature >100.4   Complete by: As directed    Diet - low sodium heart healthy   Complete by: As directed    Increase activity slowly   Complete by: As directed      Allergies as of 07/29/2019   No Known Allergies     Medication List    TAKE these medications   cholecalciferol 1000 units tablet Commonly known as: VITAMIN D Take 2,000 Units by mouth daily.   dexamethasone 1 MG tablet Commonly known as: DECADRON 2 tablets twice daily for 2 days, one tablet twice daily for 5 days, one tablet daily for 5 days.   HYDROcodone-acetaminophen 5-325 MG tablet Commonly known as: Norco Take 1 tablet by mouth every 4 (four) hours as needed for moderate pain.   omega-3 acid ethyl esters 1 g capsule Commonly known as: LOVAZA Take 2 capsules (2 g total) by mouth 2 (two) times daily.        Signed: Earleen Newport 07/29/2019, 2:34 PM

## 2019-07-30 ENCOUNTER — Other Ambulatory Visit: Payer: Self-pay | Admitting: Neurological Surgery

## 2019-07-30 DIAGNOSIS — S065XAA Traumatic subdural hemorrhage with loss of consciousness status unknown, initial encounter: Secondary | ICD-10-CM

## 2019-07-30 DIAGNOSIS — S065X9A Traumatic subdural hemorrhage with loss of consciousness of unspecified duration, initial encounter: Secondary | ICD-10-CM

## 2019-08-19 ENCOUNTER — Other Ambulatory Visit: Payer: Self-pay | Admitting: Neurological Surgery

## 2019-08-19 DIAGNOSIS — S065X9A Traumatic subdural hemorrhage with loss of consciousness of unspecified duration, initial encounter: Secondary | ICD-10-CM

## 2019-08-19 DIAGNOSIS — S065XAA Traumatic subdural hemorrhage with loss of consciousness status unknown, initial encounter: Secondary | ICD-10-CM

## 2019-08-21 ENCOUNTER — Encounter: Payer: Self-pay | Admitting: Speech Pathology

## 2019-08-21 ENCOUNTER — Other Ambulatory Visit: Payer: Self-pay

## 2019-08-21 ENCOUNTER — Ambulatory Visit: Payer: PRIVATE HEALTH INSURANCE | Attending: Neurological Surgery | Admitting: Speech Pathology

## 2019-08-21 DIAGNOSIS — R41841 Cognitive communication deficit: Secondary | ICD-10-CM | POA: Insufficient documentation

## 2019-08-21 NOTE — Therapy (Signed)
[facility] [address] [place], [place], [address] Phone: [phone]   Fax:  [phone]  Speech Language Pathology Evaluation  Patient Details  Name: Autumn Long MRN: [id number] Date of Birth: [birth year] Referring Provider (SLP): Dr. Kristeen Miss   Encounter Date: 08/21/2019  End of Session - 08/21/19 1259    Visit Number  1    Number of Visits  13    Date for SLP Re-Evaluation  10/02/19    SLP Start Time  0847    SLP Stop Time   0932    SLP Time Calculation (min)  45 min    Activity Tolerance  Patient tolerated treatment well       Past Medical History:  Diagnosis Date  . Hematuria   . Hyperlipidemia   . Hypothyroidism   . Keratosis pilaris   . Vitamin D deficiency     Past Surgical History:  Procedure Laterality Date  . CESAREAN SECTION  1991  . CESAREAN SECTION  1992  . COLONOSCOPY WITH PROPOFOL N/A 04/19/2016   Procedure: COLONOSCOPY WITH PROPOFOL;  Surgeon: Lollie Sails, MD;  Location: [facility] ENDOSCOPY;  Service: Endoscopy;  Laterality: N/A;    There were no vitals filed for this visit.  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  No/denies         SLP Evaluation [facility] - 08/21/19 [id number]      SLP Visit Information   SLP Received On  08/21/19    Referring Provider (SLP)  Dr. Kristeen Miss    Onset Date  07/25/19    Medical Diagnosis  CHI      Subjective   Patient/Family Stated Goal  to make sure everything is OK with me      General Information   HPI  Pt is a 54 yo female admitted s/p fall while walking backwards at work, hitting her L parieto-occipital region (no documented LOC). CT revealed L temporal bone fx and large L temporal contusion with SAH and mild midline shift. PMH includes: Hematuria, Hyperlipidemia, Hypothyroidism, Keratosis pilaris, Vitamin D deficienc. Hospitalized 07/25/19 to  07/29/19     Mobility Status  walks independently      Balance Screen   Has the patient fallen in the past 6 months  Yes    How many times?  1x    Has the patient had a decrease in activity level because of a fear of falling?   No    Is the patient reluctant to leave their home because of a fear of falling?   No      Prior Functional Status   Cognitive/Linguistic Baseline  Within functional limits    Type of Home  House     Lives With  Spouse    Vocation  Full time employment      Cognition   Overall Cognitive Status  Impaired/Different from baseline    Area of Impairment  Attention;Memory;Awareness    Current Attention Level  Selective    Memory  Decreased short-term memory    Awareness  Intellectual    Attention  Alternating;Divided    Alternating Attention  Impaired    Alternating Attention Impairment  Verbal complex;Functional complex    Divided Attention  Impaired    Divided Attention Impairment  Verbal basic;Functional basic    Memory  Impaired    Memory Impairment  Storage deficit;Decreased recall  of new information;Decreased short term memory    Awareness  Impaired    Awareness Impairment  Emergent impairment      Auditory Comprehension   Overall Auditory Comprehension  Impaired    Yes/No Questions  Not tested    Commands  Within Functional Limits    Conversation  Simple    Interfering Components  Attention;Processing speed    EffectiveTechniques  Extra processing time;Pausing;Repetition      Reading Comprehension   Reading Status  Not tested      Expression   Primary Mode of Expression  Verbal      Verbal Expression   Overall Verbal Expression  Appears within functional limits for tasks assessed      Written Expression   Dominant Hand  Right    Written Expression  Not tested      Oral Motor/Sensory Function   Overall Oral Motor/Sensory Function  Appears within functional limits for tasks assessed      Motor Speech   Overall Motor Speech  Appears within  functional limits for tasks assessed      Standardized Assessments   Standardized Assessments   Cognitive Linguistic Quick Test      Cognitive Linguistic Quick Test (Ages 18-69)   Attention  WNL    Memory  Mild    Executive Function  WNL    Language  WNL    Visuospatial Skills  WNL    Severity Rating Total  19    Composite Severity Rating  15.8                      SLP Education - 08/21/19 1258    Education Details  compensations for attention, memory and processing, results of CLQT and impairments    Person(s) Educated  Patient    Methods  Explanation    Comprehension  Verbalized understanding;Verbal cues required;Need further instruction         SLP Long Term Goals - 08/21/19 1307      SLP LONG TERM GOAL #1   Title  Pt will carryover 2 strategies for recall of complex information with rare min A over 2 sessions    Time  6    Period  Weeks    Status  New      SLP LONG TERM GOAL #2   Title  Pt will verbalize 3 modifications 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 Gunzenhauser is referred for outpt ST due to cognitive linguistic impairments s/p CHI. She arrives alone. Mrs. Canipe denies any changes in cognition or language. She reports she is remembering to take her medications and is not loosing items at home. She reportrs success baking and cooking. She took an Surveyor, mining to this appointment as her husband does not want her driving. The Cognitive Linguistic Quick Test (CLQT) was administered. The test revealed mild memory impairment, which I believe is also affected by attention impairment. She recalled 10/18 story elements in store retell. She answered yes/no quesitons re: story incorrectly. Mrs. Shafer did not recall 1/2 designs in design memory subtest. She has not been working, so high level memory impairment may  not be impacting functioning at home. I recommend short course of ST to maximize memory and train in  compensations for memory. The results of the CLQT were reviewed with Mrs. Treglia.    Speech Therapy Frequency  2x / week    Duration  --   6 weeks or 13 visits   Treatment/Interventions  Cognitive reorganization;Compensatory strategies;Patient/family education;SLP instruction and feedback;Functional tasks;Internal/external aids;Environmental controls;Compensatory techniques;Cueing hierarchy    Potential to Achieve Goals  Good    Consulted and Agree with Plan of Care  Patient       Patient will benefit from skilled therapeutic intervention in order to improve the following deficits and impairments:   Cognitive communication deficit    Problem List Patient Active Problem List   Diagnosis Date Noted  . Closed head injury with petechial hemorrhage of left cerebrum ([place]) 07/25/2019  . Dyslipidemia 10/18/2014  . Keratosis pilaris 10/18/2014  . Subclinical hypothyroidism 10/18/2014  . Vitamin D deficiency 10/18/2014  . History of Hashimoto thyroiditis 03/18/2013    Lace Chenevert, Annye Rusk MS, CCC-SLP 08/21/2019, 1:11 PM  [facility] [address] [place], [place], [address] Phone: [phone]   Fax:  [phone]  Name: Margerie Rahaman MRN: [id number] Date of Birth: Nov 24, 1965

## 2019-08-21 NOTE — Patient Instructions (Addendum)
   The test showed some mild short term recall and memory impairment which is normal for a brain injury  When you are receiving information from your doctor or principal repeat back what you heard or ask for important information in writing and have a family member with   Reduce background noise when conversation in important (mute TV, music, step away from loud appliances)  Play the memory game, Gilberto Better, [employer]

## 2019-08-23 ENCOUNTER — Other Ambulatory Visit: Payer: Self-pay

## 2019-08-23 ENCOUNTER — Ambulatory Visit
Admission: RE | Admit: 2019-08-23 | Discharge: 2019-08-23 | Disposition: A | Discharge status: Home / Self Care | Payer: Worker's Compensation | Source: Ambulatory Visit | Attending: Neurological Surgery | Admitting: Neurological Surgery

## 2019-08-23 DIAGNOSIS — S065X9A Traumatic subdural hemorrhage with loss of consciousness of unspecified duration, initial encounter: Secondary | ICD-10-CM

## 2019-08-23 DIAGNOSIS — S065XAA Traumatic subdural hemorrhage with loss of consciousness status unknown, initial encounter: Secondary | ICD-10-CM

## 2019-10-09 DIAGNOSIS — S062X9A Diffuse traumatic brain injury with loss of consciousness of unspecified duration, initial encounter: Secondary | ICD-10-CM | POA: Insufficient documentation

## 2019-10-30 ENCOUNTER — Telehealth: Payer: Self-pay

## 2019-10-30 NOTE — Telephone Encounter (Signed)
Copied from [place] [id number]. Topic: General - Call Back - No Documentation >> Oct 29, 2019 12:37 PM Erick Blinks wrote: Reason for CRM: [phone] Pt would like a call back from Nurse for lab orders from PCP   Called patient she would like to have labs done before her physical on 11/12/2019. Please place orders for labs and call patient when done so she will no when to come in [phone].

## 2019-10-30 NOTE — Telephone Encounter (Signed)
Or do you want her to wait?

## 2019-10-30 NOTE — Telephone Encounter (Signed)
Pt.notified

## 2019-11-11 ENCOUNTER — Encounter: Payer: BC Managed Care – PPO | Admitting: Family Medicine

## 2019-11-12 ENCOUNTER — Encounter: Payer: Self-pay | Admitting: Family Medicine

## 2019-11-12 ENCOUNTER — Ambulatory Visit (INDEPENDENT_AMBULATORY_CARE_PROVIDER_SITE_OTHER): Payer: BC Managed Care – PPO | Admitting: Family Medicine

## 2019-11-12 ENCOUNTER — Other Ambulatory Visit: Payer: Self-pay

## 2019-11-12 VITALS — BP 124/76 | HR 60 | Temp 97.9°F | Resp 16 | Ht 65.0 in | Wt 156.7 lb

## 2019-11-12 DIAGNOSIS — E559 Vitamin D deficiency, unspecified: Secondary | ICD-10-CM

## 2019-11-12 DIAGNOSIS — R7303 Prediabetes: Secondary | ICD-10-CM

## 2019-11-12 DIAGNOSIS — Z1159 Encounter for screening for other viral diseases: Secondary | ICD-10-CM

## 2019-11-12 DIAGNOSIS — Z87820 Personal history of traumatic brain injury: Secondary | ICD-10-CM

## 2019-11-12 DIAGNOSIS — E785 Hyperlipidemia, unspecified: Secondary | ICD-10-CM

## 2019-11-12 DIAGNOSIS — E781 Pure hyperglyceridemia: Secondary | ICD-10-CM

## 2019-11-12 DIAGNOSIS — Z01419 Encounter for gynecological examination (general) (routine) without abnormal findings: Secondary | ICD-10-CM

## 2019-11-12 DIAGNOSIS — R42 Dizziness and giddiness: Secondary | ICD-10-CM

## 2019-11-12 DIAGNOSIS — E039 Hypothyroidism, unspecified: Secondary | ICD-10-CM

## 2019-11-12 DIAGNOSIS — E038 Other specified hypothyroidism: Secondary | ICD-10-CM

## 2019-11-12 NOTE — Progress Notes (Signed)
Patient: Autumn Long, Female    DOB: [birth year], 54 y.o.   MRN: [id number] Steele Sizer, MD Visit Date: 11/12/2019  Today's Provider: Delsa Grana, PA-C   Chief Complaint  Patient presents with  . Annual Exam   Subjective:   Annual physical exam:  Autumn Long is a 55 y.o. female who presents today for complete physical exam:  Exercise/Activity:  Walking daily - no symptoms with exercise Diet/nutrition:  Balanced healthy diet, loves vegetables Sleep:  Sleeping really well  Recent fall with TBI - seeing neurosurgery, doing therapy, recently referred to ENT and vestibular therapy for dizziness which is one of her only remaining sx.  Pt wanted to do her physical today, PCP out of town, due to hospitalization and multiple tests and scans, pt defers mammogram and PAP until next year with PCP  USPSTF grade A and B recommendations - reviewed and addressed today  Depression:  Phq 9 completed today by patient, was reviewed by me with patient in the room PHQ score is neg, pt feels good PHQ 2/9 Scores 11/12/2019 11/07/2018 11/03/2017 11/01/2016  PHQ - 2 Score 0 0 0 0  PHQ- 9 Score 0 0 - -   Depression screen [facility] 2/9 11/12/2019 11/07/2018 11/03/2017 11/01/2016 04/15/2016  Decreased Interest 0 0 0 0 0  Down, Depressed, Hopeless 0 0 0 0 0  PHQ - 2 Score 0 0 0 0 0  Altered sleeping 0 0 - - -  Tired, decreased energy 0 0 - - -  Change in appetite 0 0 - - -  Feeling bad or failure about yourself  0 0 - - -  Trouble concentrating 0 0 - - -  Moving slowly or fidgety/restless 0 0 - - -  Suicidal thoughts 0 0 - - -  PHQ-9 Score 0 0 - - -  Difficult doing work/chores Not difficult at all - - - -    Alcohol screening:   [facility] Visit from 11/12/2019 in [facility]  AUDIT-C Score 1      Immunizations and Health Maintenance: Health Maintenance  Topic Date Due  . Hepatitis C Screening  Never done  . PAP SMEAR-Modifier  11/12/2019 (Originally 11/02/2019)  .  MAMMOGRAM  09/30/2020 (Originally 10/22/2019)  . INFLUENZA VACCINE  12/01/2019  . TETANUS/TDAP  06/30/2021  . COLONOSCOPY  04/19/2026  . COVID-19 Vaccine  Completed  . HIV Screening  Completed     Hep C Screening: doing today  STD testing and prevention (HIV/chl/gon/syphilis):  see above, no additional testing desired by pt today  Intimate partner violence:feels safe  Sexual History/Pain during Intercourse: Married, no pain   Menstrual History/LMP/Abnormal Bleeding: stopped OCP a year ago, cannot remember last menses - she thinks its been almost a year No LMP recorded. Patient is premenopausal.   Incontinence Symptoms: none  Breast cancer: due but pt wishes to defer order until next June Last Mammogram: *see HM list above BRCA gene screening: none  Cervical cancer screening: due, pt wishes to defer until next year, no sx Pt denies family hx of cancers - breast, ovarian, uterine, colon:     Osteoporosis:   Discussion on osteoporosis per age, including high calcium and vitamin D supplementation, weight bearing exercises Vit D, not currently on calcium   Skin cancer:  Hx of skin CA -  NO Discussed atypical lesions   Colorectal cancer:   Colonoscopy is UTD Discussed concerning signs and sx of CRC, pt denies melena hematochezia  Lung cancer:  Low Dose CT Chest recommended if Age 60-80 years, 30 pack-year currently smoking OR have quit w/in 15years. Patient does not qualify.    Social History   Tobacco Use  . Smoking status: Never Smoker  . Smokeless tobacco: Never Used  Vaping Use  . Vaping Use: Never used  Substance Use Topics  . Alcohol use: Yes    Alcohol/week: 0.0 standard drinks    Comment: occasionally  . Drug use: No       Office Visit from 11/12/2019 in [facility]  AUDIT-C Score 1      Family History  Problem Relation Age of Onset  . Heart disease Father   . Cancer Maternal Grandmother        Stomach     Blood  pressure/Hypertension: BP Readings from Last 3 Encounters:  11/12/19 124/76  07/29/19 130/71  11/07/18 124/76    Weight/Obesity: Wt Readings from Last 3 Encounters:  11/12/19 156 lb 11.2 oz (71.1 kg)  07/25/19 150 lb (68 kg)  11/07/18 153 lb 9.6 oz (69.7 kg)   BMI Readings from Last 3 Encounters:  11/12/19 26.08 kg/m  07/26/19 25.30 kg/m  11/07/18 25.96 kg/m     Lipids:  Lab Results  Component Value Date   CHOL 176 10/24/2018   CHOL 174 10/13/2017   CHOL 190 10/19/2016   Lab Results  Component Value Date   HDL 48 10/24/2018   HDL 56 10/13/2017   HDL 56 10/19/2016   Lab Results  Component Value Date   LDLCALC 97 10/24/2018   LDLCALC 86 10/13/2017   LDLCALC 88 10/19/2016   Lab Results  Component Value Date   TRIG 154 (H) 10/24/2018   TRIG 159 (H) 10/13/2017   TRIG 230 (A) 10/19/2016   Lab Results  Component Value Date   CHOLHDL 3.7 10/24/2018   CHOLHDL 3.1 10/13/2017   CHOLHDL 4.2 10/14/2015   No results found for: LDLDIRECT Based on the results of lipid panel his/her cardiovascular risk factor ( using [place] )  in the next 10 years is: The 10-year ASCVD risk score Mikey Bussing DC Brooke Bonito., et al., 2013) is: 1.9%   Values used to calculate the score:     Age: 43 years     Sex: Female     Is Non-Hispanic African American: No     Diabetic: No     Tobacco smoker: No     Systolic Blood Pressure: 809 mmHg     Is BP treated: No     HDL Cholesterol: 48 mg/dL     Total Cholesterol: 176 mg/dL Glucose:  Glucose  Date Value Ref Range Status  10/24/2018 90 65 - 99 mg/dL Final  10/13/2017 88 65 - 99 mg/dL Final  10/14/2015 80 65 - 99 mg/dL Final   Glucose, Bld  Date Value Ref Range Status  07/25/2019 139 (H) 70 - 99 mg/dL Final    Comment:    Glucose reference range applies only to samples taken after fasting for at least 8 hours.   Hypertension: BP Readings from Last 3 Encounters:  11/12/19 124/76  07/29/19 130/71  11/07/18 124/76   Obesity: Wt  Readings from Last 3 Encounters:  11/12/19 156 lb 11.2 oz (71.1 kg)  07/25/19 150 lb (68 kg)  11/07/18 153 lb 9.6 oz (69.7 kg)   BMI Readings from Last 3 Encounters:  11/12/19 26.08 kg/m  07/26/19 25.30 kg/m  11/07/18 25.96 kg/m      Advanced Care Planning:  A voluntary discussion  about advance care planning including the explanation and discussion of advance directives.   Discussed health care proxy and Living will, and the patient was able to identify a health care proxy as husband.   Patient does have a living will at present time.   Social History      She        Social History   Socioeconomic History  . Marital status: Married    Spouse name: Midwife  . Number of children: 2  . Years of education: 49  . Highest education level: Bachelor's degree (e.g., BA, AB, BS)  Occupational History  . Not on file  Tobacco Use  . Smoking status: Never Smoker  . Smokeless tobacco: Never Used  Vaping Use  . Vaping Use: Never used  Substance and Sexual Activity  . Alcohol use: Yes    Alcohol/week: 0.0 standard drinks    Comment: occasionally  . Drug use: No  . Sexual activity: Yes    Partners: Male  Other Topics Concern  . Not on file  Social History Narrative   Haematologist   Social Determinants of Health   Financial Resource Strain:   . Difficulty of Paying Living Expenses:   Food Insecurity:   . Worried About Charity fundraiser in the Last Year:   . Arboriculturist in the Last Year:   Transportation Needs:   . Film/video editor (Medical):   Marland Kitchen Lack of Transportation (Non-Medical):   Physical Activity:   . Days of Exercise per Week:   . Minutes of Exercise per Session:   Stress:   . Feeling of Stress :   Social Connections:   . Frequency of Communication with Friends and Family:   . Frequency of Social Gatherings with Friends and Family:   . Attends Religious Services:   . Active Member of Clubs or Organizations:   . Attends Theatre manager Meetings:   Marland Kitchen Marital Status:     Family History        Family History  Problem Relation Age of Onset  . Heart disease Father   . Cancer Maternal Grandmother        Stomach    Patient Active Problem List   Diagnosis Date Noted  . 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 Dyslipidemia 10/18/2014  . Keratosis pilaris 10/18/2014  . Subclinical hypothyroidism 10/18/2014  . Vitamin D deficiency 10/18/2014  . History of Hashimoto thyroiditis 03/18/2013    Past Surgical History:  Procedure Laterality Date  . CESAREAN SECTION  1991  . CESAREAN SECTION  1992  . COLONOSCOPY WITH PROPOFOL N/A 04/19/2016   Procedure: COLONOSCOPY WITH PROPOFOL;  Surgeon: Lollie Sails, MD;  Location[facility] ENDOSCOPY;  Service: Endoscopy;  Laterality: N/A;     Current Outpatient Medications:  .  cholecalciferol (VITAMIN D) 1000 UNITS tablet, Take 2,000 Units by mouth daily., Disp: , Rfl:  .  omega-3 acid ethyl esters (LOVAZA) 1 g capsule, Take 2 capsules (2 g total) by mouth 2 (two) times daily., Disp: 360 capsule, Rfl: 3  No Known Allergies  Patient Care Team: Steele Sizer, MD as PCP - General (Family Medicine) Kristeen Miss, MD as Consulting Physician (Neurosurgery)  Review of Systems  Constitutional: Negative.  Negative for activity change, appetite change, fatigue and unexpected weight change.  HENT: Negative.   Eyes: Negative.   Respiratory: Negative.  Negative for chest tightness and shortness of breath.   Cardiovascular:  Negative.  Negative for chest pain, palpitations and leg swelling.  Gastrointestinal: Negative.  Negative for abdominal pain and blood in stool.  Endocrine: Negative.   Genitourinary: Negative.   Musculoskeletal: Negative.  Negative for arthralgias, gait problem, joint swelling and myalgias.  Skin: Negative.  Negative for color change, pallor and rash.  Allergic/Immunologic: Negative.   Neurological: Positive  for dizziness. Negative for seizures, syncope, speech difficulty, weakness and numbness.  Hematological: Negative.   Psychiatric/Behavioral: Negative.  Negative for confusion, dysphoric mood, self-injury and suicidal ideas. The patient is not nervous/anxious.   All other systems reviewed and are negative.       I personally reviewed active problem list, medication list, allergies, family history, social history, health maintenance, notes from last encounter, lab results, imaging with the patient/caregiver today.        Objective:   Vitals:  Vitals:   11/12/19 0825  BP: 124/76  Pulse: 60  Resp: 16  Temp: 97.9 F (36.6 C)  TempSrc: Temporal  SpO2: 99%  Weight: 156 lb 11.2 oz (71.1 kg)  Height: _0  (1.651 m)    Body mass index is 26.08 kg/m.  Physical Exam Vitals and nursing note reviewed.  Constitutional:      General: She is not in acute distress.    Appearance: Normal appearance. She is well-developed. She is not ill-appearing, toxic-appearing or diaphoretic.  HENT:     Head: Normocephalic and atraumatic.     Right Ear: Tympanic membrane, ear canal and external ear normal. There is no impacted cerumen.     Left Ear: Tympanic membrane, ear canal and external ear normal. There is no impacted cerumen.     Nose: Nose normal. No congestion or rhinorrhea.     Mouth/Throat:     Mouth: Mucous membranes are moist.     Pharynx: Oropharynx is clear. Uvula midline. No oropharyngeal exudate or posterior oropharyngeal erythema.  Eyes:     General: Lids are normal.        Right eye: No discharge.        Left eye: No discharge.     Pupils: Pupils are equal, round, and reactive to light.  Neck:     Trachea: Phonation normal. No tracheal deviation.  Cardiovascular:     Rate and Rhythm: Normal rate and regular rhythm.     Pulses: Normal pulses.          Radial pulses are 2+ on the right side and 2+ on the left side.       Posterior tibial pulses are 2+ on the right side and 2+  on the left side.     Heart sounds: Normal heart sounds. No murmur heard.  No friction rub. No gallop.   Pulmonary:     Effort: Pulmonary effort is normal. No respiratory distress.     Breath sounds: Normal breath sounds. No stridor. No wheezing, rhonchi or rales.  Chest:     Chest wall: No tenderness.  Abdominal:     General: Bowel sounds are normal. There is no distension.     Palpations: Abdomen is soft.     Tenderness: There is no abdominal tenderness. There is no guarding or rebound.  Musculoskeletal:        General: No deformity.     Right lower leg: No edema.     Left lower leg: No edema.  Skin:    General: Skin is warm and dry.     Coloration: Skin is not pale.  Findings: No rash.  Neurological:     Mental Status: She is alert.     Motor: No abnormal muscle tone.     Gait: Gait normal.  Psychiatric:        Mood and Affect: Mood normal.        Speech: Speech normal.        Behavior: Behavior normal.       Fall Risk: Fall Risk  11/12/2019 11/07/2018 11/03/2017 10/10/2017 11/01/2016  Falls in the past year? 1 0 No No No  Number falls in past yr: 0 0 - - -  Injury with Fall? 1 0 - - -  Follow up Falls evaluation completed - - - -    Functional Status Survey: Is the patient deaf or have difficulty hearing?: No Does the patient have difficulty seeing, even when wearing glasses/contacts?: No Does the patient have difficulty concentrating, remembering, or making decisions?: No Does the patient have difficulty walking or climbing stairs?: No Does the patient have difficulty dressing or bathing?: No Does the patient have difficulty doing errands alone such as visiting a doctor's office or shopping?: No   Assessment & Plan:    CPE completed today  . USPSTF grade A and B recommendations reviewed with patient; age-appropriate recommendations, preventive care, screening tests, etc discussed and encouraged; healthy living encouraged; see AVS for patient education given to  patient  . Discussed importance of 150 minutes of physical activity weekly, AHA exercise recommendations given to pt in AVS/handout  . Discussed importance of healthy diet:  eating lean meats and proteins, avoiding trans fats and saturated fats, avoid simple sugars and excessive carbs in diet, eat 6 servings of fruit/vegetables daily and drink plenty of water and avoid sweet beverages.    . Recommended pt to do annual eye exam and routine dental exams/cleanings  . Depression, alcohol, fall screening completed as documented above and per flowsheets  . Reviewed Health Maintenance: Health Maintenance  Topic Date Due  . Hepatitis C Screening  Never done  . PAP SMEAR-Modifier  11/12/2019 (Originally 11/02/2019)  . MAMMOGRAM  09/30/2020 (Originally 10/22/2019)  . INFLUENZA VACCINE  12/01/2019  . TETANUS/TDAP  06/30/2021  . COLONOSCOPY  04/19/2026  . COVID-19 Vaccine  Completed  . HIV Screening  Completed  declines mammogram and pap today due to current health issues and recent hx  . Immunizations: Immunization History  Administered Date(s) Administered  . Influenza,inj,Quad PF,6+ Mos 02/09/2017  . PFIZER SARS-COV-2 Vaccination 06/29/2019, 07/20/2019  . Tdap 07/01/2011      ICD-10-CM   1. Well woman exam  Z01.419 CBC w/Diff/Platelet    Hemoglobin A1C    Lipid panel    Comprehensive Metabolic Panel (CMET)    CANCELED: Lipid panel    CANCELED: COMPLETE METABOLIC PANEL WITH GFR    CANCELED: CBC w/Diff/Platelet    CANCELED: Hemoglobin A1C  2. Pre-diabetes  R73.03 Hemoglobin A1C    CANCELED: Hemoglobin A1C   last A1C was 4 months ago and was normal, some random high blood sugars and triglycerides  3. Dyslipidemia  E78.5 Lipid panel    CANCELED: Lipid panel   on lovaza, eating healthy, taking supplement  4. Hypertriglyceridemia  E78.1 Lipid panel    CANCELED: Lipid panel   same as #3  5. Encounter for hepatitis C screening test for low risk patient  Z11.59 Hepatitis C Antibody     CANCELED: Hepatitis C Antibody  6. Subclinical hypothyroidism  E03.9    recent TSH was normal, no current sx,  will not recheck  7. Vitamin D deficiency  E55.9    compliant with supplement   8. History of traumatic brain injury  Z87.820    care team updated, pt still doing therapy, aphasia improved, HA's improved, some vertigo and rightsided muscle cramps - managed by neurosurgery  9. Vertigo  R42    sequella of TBI/concussion, recently referred to vestibular therapy and ENT   CT scans, recent labs, recent admission H&P/discharge summary and neurosurgery visits reviewed, labs reviewed with pt.  Care team updated  PCP will be notified of pts choice to defer mammogram and PAP to next year.   Delsa Grana, PA-C 11/12/19 9:21 AM  [facility]

## 2019-11-12 NOTE — Patient Instructions (Signed)
Preventive Care 40-55 Years Old, Female Preventive care refers to visits with your health care provider and lifestyle choices that can promote health and wellness. This includes:  A yearly physical exam. This may also be called an annual well check.  Regular dental visits and eye exams.  Immunizations.  Screening for certain conditions.  Healthy lifestyle choices, such as eating a healthy diet, getting regular exercise, not using drugs or products that contain nicotine and tobacco, and limiting alcohol use. What can I expect for my preventive care visit? Physical exam Your health care provider will check your:  Height and weight. This may be used to calculate body mass index (BMI), which tells if you are at a healthy weight.  Heart rate and blood pressure.  Skin for abnormal spots. Counseling Your health care provider may ask you questions about your:  Alcohol, tobacco, and drug use.  Emotional well-being.  Home and relationship well-being.  Sexual activity.  Eating habits.  Work and work environment.  Method of birth control.  Menstrual cycle.  Pregnancy history. What immunizations do I need?  Influenza (flu) vaccine  This is recommended every year. Tetanus, diphtheria, and pertussis (Tdap) vaccine  You may need a Td booster every 10 years. Varicella (chickenpox) vaccine  You may need this if you have not been vaccinated. Zoster (shingles) vaccine  You may need this after age 60. Measles, mumps, and rubella (MMR) vaccine  You may need at least one dose of MMR if you were born in 1957 or later. You may also need a second dose. Pneumococcal conjugate (PCV13) vaccine  You may need this if you have certain conditions and were not previously vaccinated. Pneumococcal polysaccharide (PPSV23) vaccine  You may need one or two doses if you smoke cigarettes or if you have certain conditions. Meningococcal conjugate (MenACWY) vaccine  You may need this if you  have certain conditions. Hepatitis A vaccine  You may need this if you have certain conditions or if you travel or work in places where you may be exposed to hepatitis A. Hepatitis B vaccine  You may need this if you have certain conditions or if you travel or work in places where you may be exposed to hepatitis B. Haemophilus influenzae type b (Hib) vaccine  You may need this if you have certain conditions. Human papillomavirus (HPV) vaccine  If recommended by your health care provider, you may need three doses over 6 months. You may receive vaccines as individual doses or as more than one vaccine together in one shot (combination vaccines). Talk with your health care provider about the risks and benefits of combination vaccines. What tests do I need? Blood tests  Lipid and cholesterol levels. These may be checked every 5 years, or more frequently if you are over 50 years old.  Hepatitis C test.  Hepatitis B test. Screening  Lung cancer screening. You may have this screening every year starting at age 55 if you have a 30-pack-year history of smoking and currently smoke or have quit within the past 15 years.  Colorectal cancer screening. All adults should have this screening starting at age 50 and continuing until age 75. Your health care provider may recommend screening at age 45 if you are at increased risk. You will have tests every 1-10 years, depending on your results and the type of screening test.  Diabetes screening. This is done by checking your blood sugar (glucose) after you have not eaten for a while (fasting). You may have this   done every 1-3 years.  Mammogram. This may be done every 1-2 years. Talk with your health care provider about when you should start having regular mammograms. This may depend on whether you have a family history of breast cancer.  BRCA-related cancer screening. This may be done if you have a family history of breast, ovarian, tubal, or peritoneal  cancers.  Pelvic exam and Pap test. This may be done every 3 years starting at age 21. Starting at age 30, this may be done every 5 years if you have a Pap test in combination with an HPV test. Other tests  Sexually transmitted disease (STD) testing.  Bone density scan. This is done to screen for osteoporosis. You may have this scan if you are at high risk for osteoporosis. Follow these instructions at home: Eating and drinking  Eat a diet that includes fresh fruits and vegetables, whole grains, lean protein, and low-fat dairy.  Take vitamin and mineral supplements as recommended by your health care provider.  Do not drink alcohol if: ? Your health care provider tells you not to drink. ? You are pregnant, may be pregnant, or are planning to become pregnant.  If you drink alcohol: ? Limit how much you have to 0-1 drink a day. ? Be aware of how much alcohol is in your drink. In the [place] one drink equals one 12 oz bottle of beer (355 mL), one 5 oz glass of wine (148 mL), or one 1 oz glass of hard liquor (44 mL). Lifestyle  Take daily care of your teeth and gums.  Stay active. Exercise for at least 30 minutes on 5 or more days each week.  Do not use any products that contain nicotine or tobacco, such as cigarettes, e-cigarettes, and chewing tobacco. If you need help quitting, ask your health care provider.  If you are sexually active, practice safe sex. Use a condom or other form of birth control (contraception) in order to prevent pregnancy and STIs (sexually transmitted infections).  If told by your health care provider, take low-dose aspirin daily starting at age 50. What's next?  Visit your health care provider once a year for a well check visit.  Ask your health care provider how often you should have your eyes and teeth checked.  Stay up to date on all vaccines. This information is not intended to replace advice given to you by your health care provider. Make sure you  discuss any questions you have with your health care provider. Document Revised: 12/28/2017 Document Reviewed: 12/28/2017 Elsevier Patient Education  2020 Elsevier Inc.   Preventing Osteoporosis, Adult Osteoporosis is a condition that causes the bones to lose density. This means that the bones become thinner, and the normal spaces in bone tissue become larger. Low bone density can make the bones weak and cause them to break more easily. Osteoporosis cannot always be prevented, but you can take steps to lower your risk of developing this condition. How can this condition affect me? If you develop osteoporosis, you will be more likely to break bones in your wrist, spine, or hip. Even a minor accident or injury can be enough to break weak bones. The bones will also be slower to heal. Osteoporosis can cause other problems as well, such as a stooped posture or trouble with movement. Osteoporosis can occur with aging. As you get older, you may lose bone tissue more quickly, or it may be replaced more slowly. Osteoporosis is more likely to develop if you have   poor nutrition or do not get enough calcium or vitamin D. Other lifestyle factors can also play a role. By eating a well-balanced diet and making lifestyle changes, you can help keep your bones strong and healthy, lowering your chances of developing osteoporosis. What can increase my risk? The following factors may make you more likely to develop osteoporosis:  Having a family history of the condition.  Having poor nutrition or not getting enough calcium or vitamin D.  Using certain medicines, such as steroid medicines or antiseizure medicines.  Being any of the following: ? 50 years of age or older. ? Female. ? A woman who has gone through menopause (is postmenopausal). ? White (Caucasian) or of Asian descent.  Smoking or having a history of smoking.  Not being physically active (being sedentary).  Having a small body frame. What  actions can I take to prevent this?  Get enough calcium   Make sure you get enough calcium every day. Calcium is the most important mineral for bone health. Most people can get enough calcium from their diet, but supplements may be recommended for people who are at risk for osteoporosis. Follow these guidelines: ? If you are age 50 or younger, aim to get 1,000 mg of calcium every day. ? If you are older than age 50, aim to get 1,200 mg of calcium every day.  Good sources of calcium include: ? Dairy products, such as low-fat or nonfat milk, cheese, and yogurt. ? Dark green leafy vegetables, such as bok choy and broccoli. ? Foods that have had calcium added to them (calcium-fortified foods), such as orange juice, cereal, bread, soy beverages, and tofu products. ? Nuts, such as almonds.  Check nutrition labels to see how much calcium is in a food or drink. Get enough vitamin D  Try to get enough vitamin D every day. Vitamin D is the most essential vitamin for bone health. It helps the body absorb calcium. Follow these guidelines for how much vitamin D to get from food: ? If you are age 70 or younger, aim to get at least 600 international units (IU) every day. Your health care provider may suggest more. ? If you are older than age 70, aim to get at least 800 international units every day. Your health care provider may suggest more.  Good sources of vitamin D in your diet include: ? Egg yolks. ? Oily fish, such as salmon, sardines, and tuna. ? Milk and cereal fortified with vitamin D.  Your body also makes vitamin D when you are out in the sun. Exposing the bare skin on your face, arms, legs, or back to the sun for no more than 30 minutes a day, 2 times a week is more than enough. Beyond that, make sure you use sunblock to protect your skin from sunburn, which increases your risk for skin cancer. Exercise  Stay active and get exercise every day.  Ask your health care provider what types of  exercise are best for you. Weight-bearing and strength-building activities are important for building and maintaining healthy bones. Some examples of these types of activities include: ? Walking and hiking. ? Jogging and running. ? Dancing. ? Gym exercises. ? Lifting weights. ? Tennis and racquetball. ? Climbing stairs. ? Aerobics. Make other lifestyle changes  Do not use any products that contain nicotine or tobacco, such as cigarettes, e-cigarettes, and chewing tobacco. If you need help quitting, ask your health care provider.  Lose weight if you are overweight.    If you drink alcohol: ? Limit how much you use to:  0-1 drink a day for nonpregnant women.  0-2 drinks a day for men. ? Be aware of how much alcohol is in your drink. In the [place] one drink equals one 12 oz bottle of beer (355 mL), one 5 oz glass of wine (148 mL), or one 1 oz glass of hard liquor (44 mL). Where to find support If you need help making changes to prevent osteoporosis, talk with your health care provider. You can ask for a referral to a diet and nutrition specialist (dietitian) and a physical therapist. Where to find more information Learn more about osteoporosis from:  NIH Osteoporosis and Related Bone Diseases National Resource Center: www.bones.nih.gov  [place]S. Office on Women's Health: www.womenshealth.gov  National Osteoporosis Foundation: www.nof.org Summary  Osteoporosis is a condition that causes weak bones that are more likely to break.  Eat a healthy diet, making sure you get enough calcium and vitamin D, and stay active by getting regular exercise to help prevent osteoporosis.  Other ways to reduce your risk of osteoporosis include maintaining a healthy weight and avoiding alcohol and products that contain nicotine or tobacco. This information is not intended to replace advice given to you by your health care provider. Make sure you discuss any questions you have with your health care  provider. Document Revised: 11/16/2018 Document Reviewed: 11/16/2018 Elsevier Patient Education  2020 Elsevier Inc.  

## 2019-11-13 LAB — CBC WITH DIFFERENTIAL/PLATELET
Basophils Absolute: 0 10*3/uL (ref 0.0–0.2)
Basos: 0 %
EOS (ABSOLUTE): 0.1 10*3/uL (ref 0.0–0.4)
Eos: 1 %
Hematocrit: 42.1 % (ref 34.0–46.6)
Hemoglobin: 13.3 g/dL (ref 11.1–15.9)
Immature Grans (Abs): 0 10*3/uL (ref 0.0–0.1)
Immature Granulocytes: 0 %
Lymphocytes Absolute: 2.3 10*3/uL (ref 0.7–3.1)
Lymphs: 33 %
MCH: 27.4 pg (ref 26.6–33.0)
MCHC: 31.6 g/dL (ref 31.5–35.7)
MCV: 87 fL (ref 79–97)
Monocytes Absolute: 0.5 10*3/uL (ref 0.1–0.9)
Monocytes: 8 %
Neutrophils Absolute: 3.9 10*3/uL (ref 1.4–7.0)
Neutrophils: 58 %
Platelets: 175 10*3/uL (ref 150–450)
RBC: 4.85 x10E6/uL (ref 3.77–5.28)
RDW: 13.3 % (ref 11.7–15.4)
WBC: 6.8 10*3/uL (ref 3.4–10.8)

## 2019-11-13 LAB — COMPREHENSIVE METABOLIC PANEL
ALT: 37 IU/L — ABNORMAL HIGH (ref 0–32)
AST: 22 IU/L (ref 0–40)
Albumin/Globulin Ratio: 2 (ref 1.2–2.2)
Albumin: 5 g/dL — ABNORMAL HIGH (ref 3.8–4.9)
Alkaline Phosphatase: 83 IU/L (ref 48–121)
BUN/Creatinine Ratio: 22 (ref 9–23)
BUN: 16 mg/dL (ref 6–24)
Bilirubin Total: 0.6 mg/dL (ref 0.0–1.2)
CO2: 23 mmol/L (ref 20–29)
Calcium: 9.9 mg/dL (ref 8.7–10.2)
Chloride: 102 mmol/L (ref 96–106)
Creatinine, Ser: 0.73 mg/dL (ref 0.57–1.00)
GFR calc Af Amer: 107 mL/min/{1.73_m2} (ref >59)
GFR calc non Af Amer: 93 mL/min/{1.73_m2} (ref >59)
Globulin, Total: 2.5 g/dL (ref 1.5–4.5)
Glucose: 94 mg/dL (ref 65–99)
Potassium: 5 mmol/L (ref 3.5–5.2)
Sodium: 138 mmol/L (ref 134–144)
Total Protein: 7.5 g/dL (ref 6.0–8.5)

## 2019-11-13 LAB — LIPID PANEL
Chol/HDL Ratio: 4.4 ratio (ref 0.0–4.4)
Cholesterol, Total: 235 mg/dL — ABNORMAL HIGH (ref 100–199)
HDL: 53 mg/dL (ref >39)
LDL Chol Calc (NIH): 166 mg/dL — ABNORMAL HIGH (ref 0–99)
Triglycerides: 92 mg/dL (ref 0–149)
VLDL Cholesterol Cal: 16 mg/dL (ref 5–40)

## 2019-11-13 LAB — HEMOGLOBIN A1C
Est. average glucose Bld gHb Est-mCnc: 111 mg/dL
Hgb A1c MFr Bld: 5.5 % (ref 4.8–5.6)

## 2019-11-13 LAB — HEPATITIS C ANTIBODY: Hep C Virus Ab: <0.1 s/co ratio (ref 0.0–0.9)

## 2019-11-19 DIAGNOSIS — Z8781 Personal history of (healed) traumatic fracture: Secondary | ICD-10-CM | POA: Insufficient documentation

## 2019-11-19 DIAGNOSIS — H903 Sensorineural hearing loss, bilateral: Secondary | ICD-10-CM | POA: Insufficient documentation

## 2019-11-19 DIAGNOSIS — R430 Anosmia: Secondary | ICD-10-CM | POA: Insufficient documentation

## 2019-11-19 DIAGNOSIS — H8110 Benign paroxysmal vertigo, unspecified ear: Secondary | ICD-10-CM | POA: Insufficient documentation

## 2019-12-13 ENCOUNTER — Encounter: Payer: Self-pay | Admitting: Family Medicine

## 2020-02-17 ENCOUNTER — Other Ambulatory Visit: Payer: Self-pay | Admitting: Family Medicine

## 2020-02-17 DIAGNOSIS — E781 Pure hyperglyceridemia: Secondary | ICD-10-CM

## 2020-02-18 ENCOUNTER — Other Ambulatory Visit: Payer: Self-pay | Admitting: Family Medicine

## 2020-02-18 DIAGNOSIS — E781 Pure hyperglyceridemia: Secondary | ICD-10-CM

## 2020-03-04 DIAGNOSIS — G5600 Carpal tunnel syndrome, unspecified upper limb: Secondary | ICD-10-CM | POA: Insufficient documentation

## 2020-03-04 DIAGNOSIS — Z6827 Body mass index (BMI) 27.0-27.9, adult: Secondary | ICD-10-CM | POA: Insufficient documentation

## 2020-03-04 DIAGNOSIS — M5416 Radiculopathy, lumbar region: Secondary | ICD-10-CM | POA: Insufficient documentation

## 2020-04-23 IMAGING — CT CT HEAD W/O CM
1 series · 15 of 30 positions shown, 19 images · non-contrast
Comparison: Prior head CT examinations 07/29/2019 and earlier

CLINICAL DATA: Subdural hematoma, traumatic brain injury, skull
fracture, headache, improving each day.

EXAM:
CT HEAD WITHOUT CONTRAST
TECHNIQUE: Contiguous axial images were obtained from the base of the skull
through the vertex without intravenous contrast.

[Series 2: head w/(date) · axial · 0.44mm/px · z∈[+839,+989]mm · 15 of 34 slices shown, 19 images]
[im 2/34  brain]
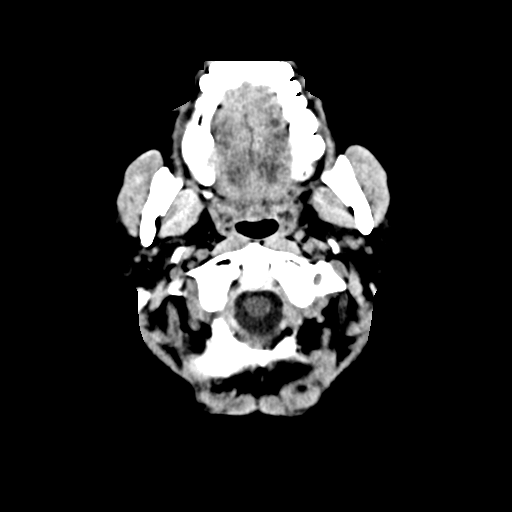
[im 2/34  bone]
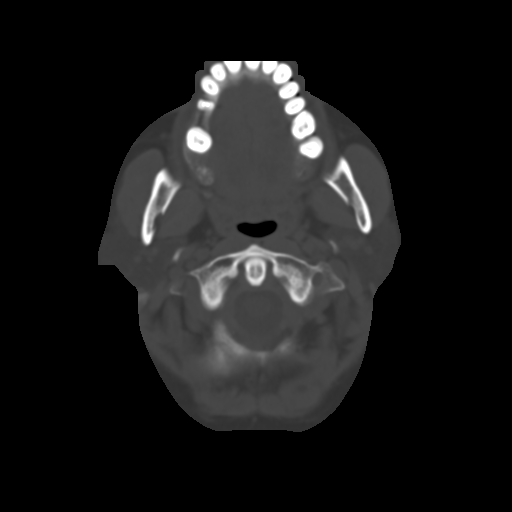
[im 4/34  brain]
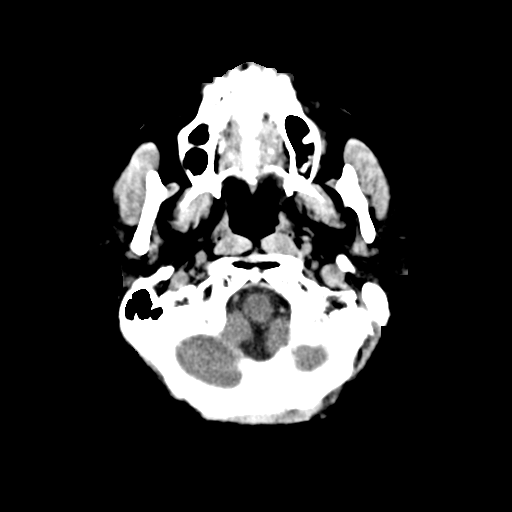
[im 6/34  brain]
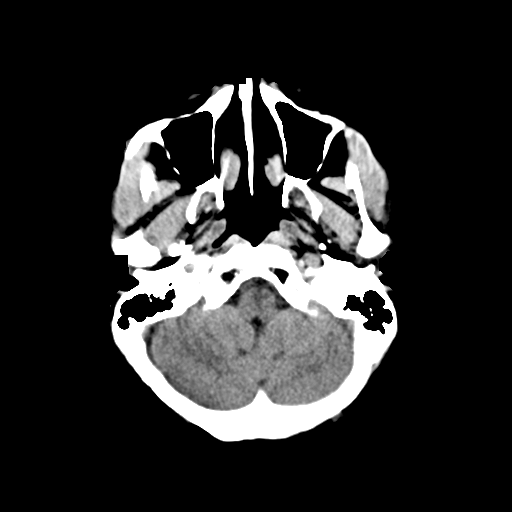
[im 8/34  brain]
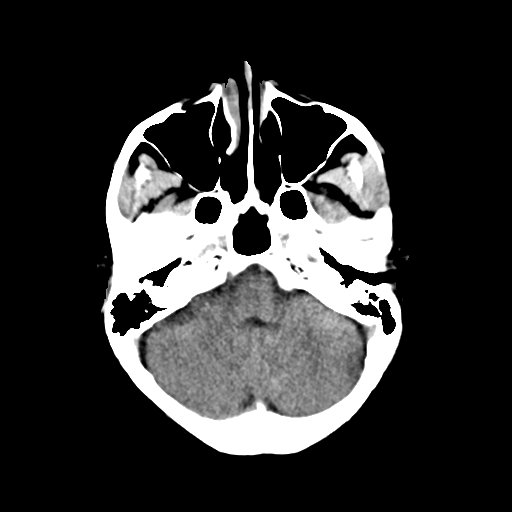
[im 11/34  brain]
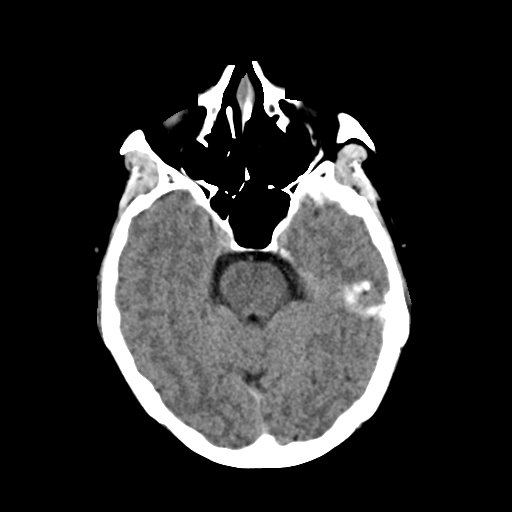
[im 11/34  bone]
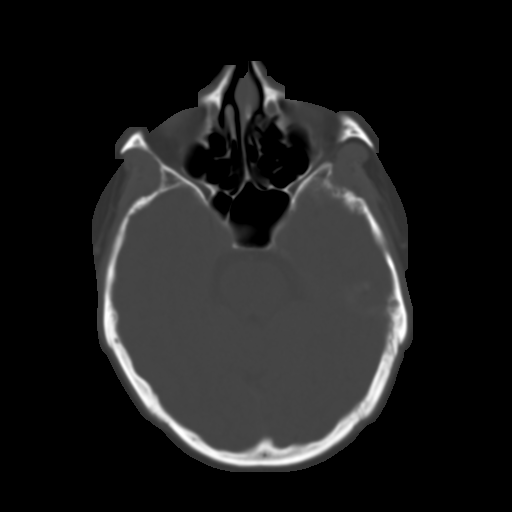
[im 13/34  brain]
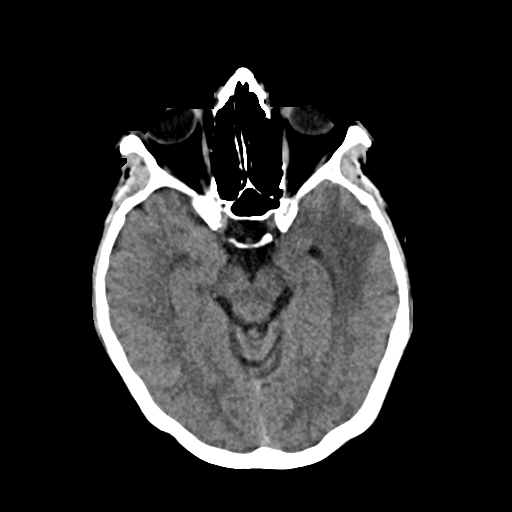
[im 15/34  brain]
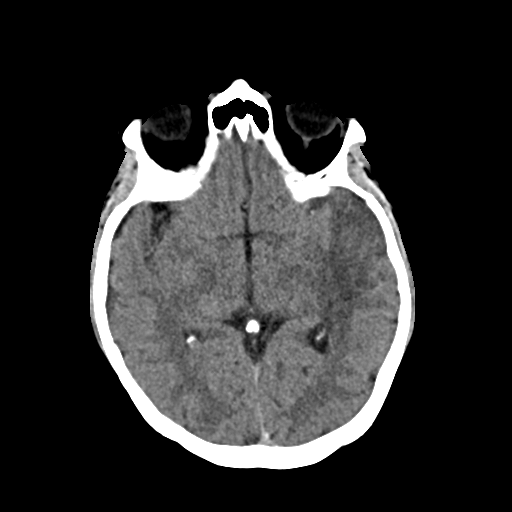
[im 18/34  brain]
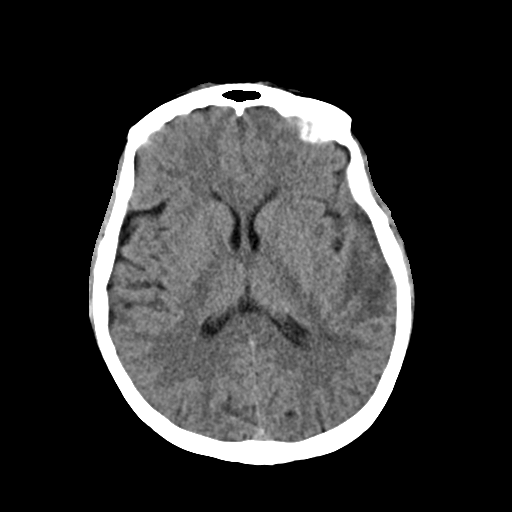
[im 19/34  brain]
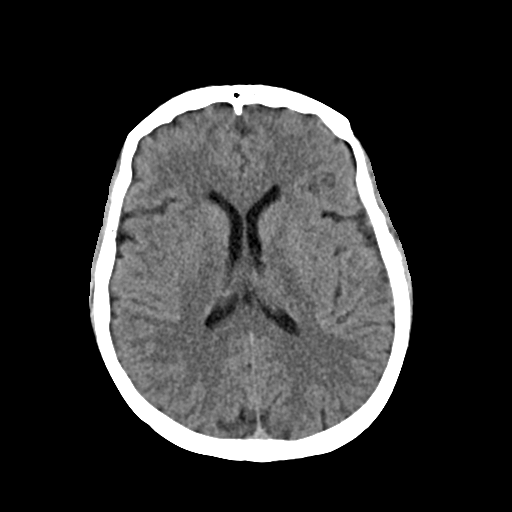
[im 19/34  bone]
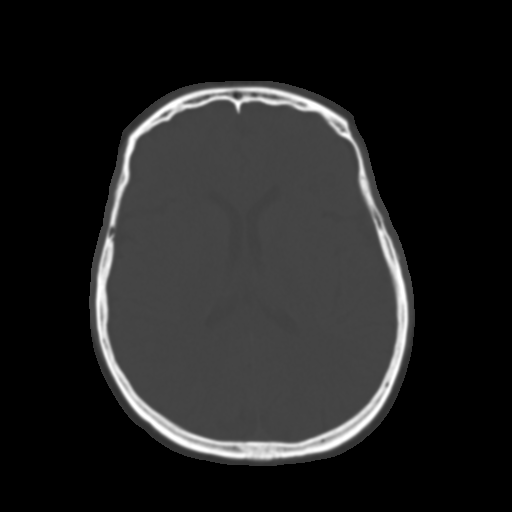
[im 21/34  brain]
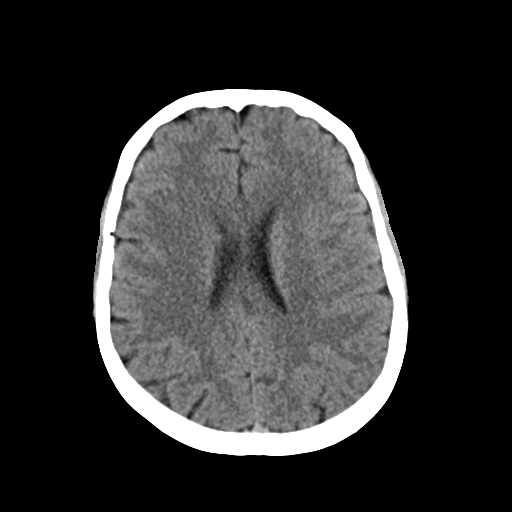
[im 23/34  brain]
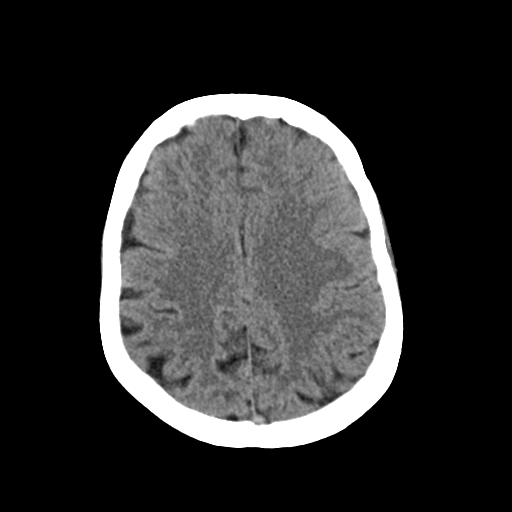
[im 26/34  brain]
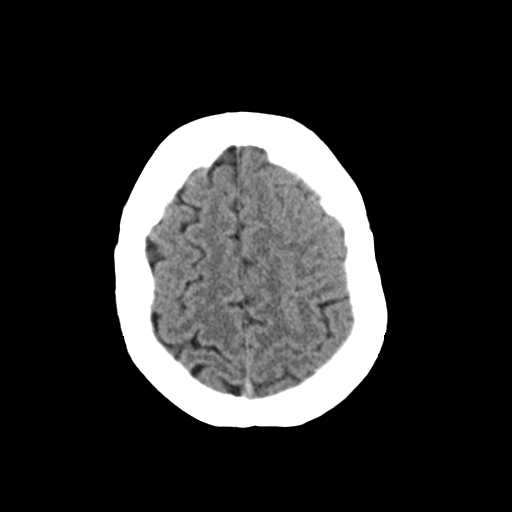
[im 28/34  brain]
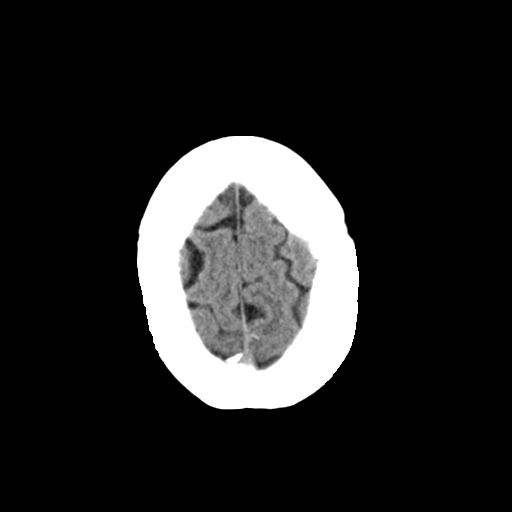
[im 28/34  bone]
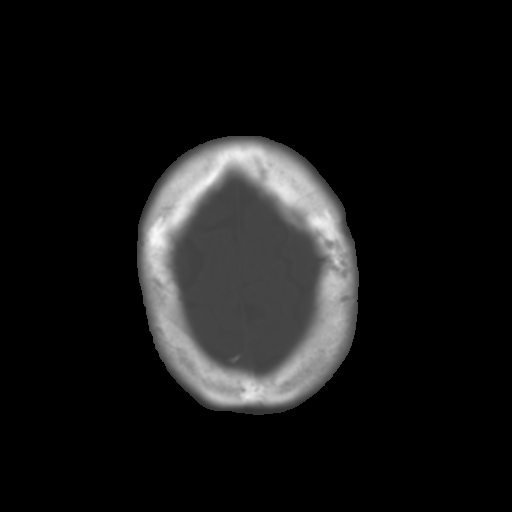
[im 30/34  brain]
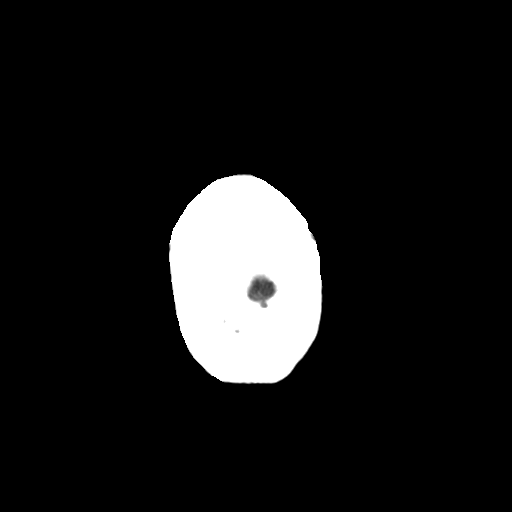
[im 32/34  brain]
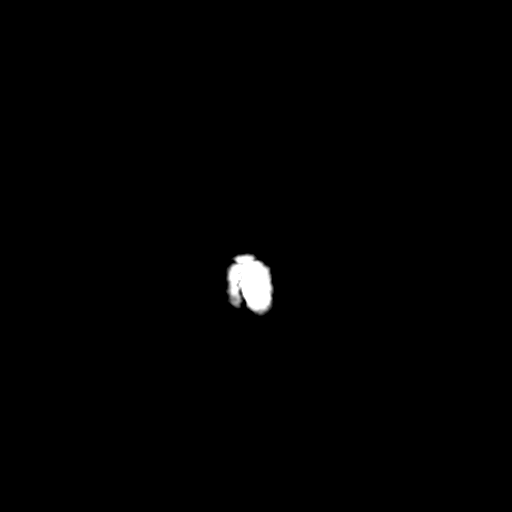

[15 of 30 positions shown; findings below may reference images not displayed]

FINDINGS: Brain:

Hyperdense blood products in the region of a known left temporal
lobe parenchymal contusion have essentially resolved. Persistent
edema at this site. Associated mass effect has decreased. Midline
shift is no longer present. Scattered subarachnoid hemorrhage
previously demonstrated along the left cerebral hemisphere is no
longer appreciable. No evidence of interval intracranial
abnormality.

Vascular: No hyperdense vessel.

Skull: Known recent left temporal bone fracture.

Sinuses/Orbits: Visualized orbits demonstrate no acute abnormality.
No significant paranasal sinus disease at the imaged levels. A
previously demonstrated left mastoid effusion has resolved.
IMPRESSION: Expected interval evolution of a left temporal lobe parenchymal
contusion. Associated hyperdense parenchymal and overlying
subarachnoid blood products have essentially resolved. Persistent
edema at this site, although with decreasing mass effect. Midline
shift is no longer present.

Known recent left temporal bone fracture.

## 2020-06-24 ENCOUNTER — Other Ambulatory Visit: Payer: Self-pay | Admitting: Family Medicine

## 2020-06-24 DIAGNOSIS — E781 Pure hyperglyceridemia: Secondary | ICD-10-CM

## 2020-07-31 DIAGNOSIS — S069X9A Unspecified intracranial injury with loss of consciousness of unspecified duration, initial encounter: Secondary | ICD-10-CM | POA: Insufficient documentation

## 2020-09-29 ENCOUNTER — Other Ambulatory Visit: Payer: Self-pay | Admitting: Family Medicine

## 2020-09-29 ENCOUNTER — Telehealth: Payer: Self-pay

## 2020-09-29 DIAGNOSIS — E559 Vitamin D deficiency, unspecified: Secondary | ICD-10-CM

## 2020-09-29 DIAGNOSIS — Z8639 Personal history of other endocrine, nutritional and metabolic disease: Secondary | ICD-10-CM

## 2020-09-29 DIAGNOSIS — R7303 Prediabetes: Secondary | ICD-10-CM

## 2020-09-29 DIAGNOSIS — E781 Pure hyperglyceridemia: Secondary | ICD-10-CM

## 2020-09-29 DIAGNOSIS — Z79899 Other long term (current) drug therapy: Secondary | ICD-10-CM

## 2020-09-29 DIAGNOSIS — E038 Other specified hypothyroidism: Secondary | ICD-10-CM

## 2020-09-29 DIAGNOSIS — E785 Hyperlipidemia, unspecified: Secondary | ICD-10-CM

## 2020-09-29 NOTE — Telephone Encounter (Signed)
Copied from [place] [id number]. Topic: General - Other >> Sep 25, 2020 12:24 PM Valere Dross wrote: Reason for CRM: Patient called requesting to have lab orders placed prior CPE on 07/15, patient is requesting a call back once this is done. Please Advise

## 2020-09-30 LAB — HM MAMMOGRAPHY

## 2020-09-30 NOTE — Telephone Encounter (Signed)
Left vm notifying pt lab orders are in and she can pick up if she'd like

## 2020-10-02 ENCOUNTER — Other Ambulatory Visit: Payer: Self-pay | Admitting: Family Medicine

## 2020-10-02 DIAGNOSIS — E038 Other specified hypothyroidism: Secondary | ICD-10-CM

## 2020-10-02 DIAGNOSIS — Z8639 Personal history of other endocrine, nutritional and metabolic disease: Secondary | ICD-10-CM

## 2020-10-02 LAB — HEMOGLOBIN A1C
Est. average glucose Bld gHb Est-mCnc: 114 mg/dL
Hgb A1c MFr Bld: 5.6 % (ref 4.8–5.6)

## 2020-10-02 LAB — CBC WITH DIFFERENTIAL/PLATELET
Basophils Absolute: 0 10*3/uL (ref 0.0–0.2)
Basos: 0 %
EOS (ABSOLUTE): 0.1 10*3/uL (ref 0.0–0.4)
Eos: 1 %
Hematocrit: 42.9 % (ref 34.0–46.6)
Hemoglobin: 14.1 g/dL (ref 11.1–15.9)
Immature Grans (Abs): 0 10*3/uL (ref 0.0–0.1)
Immature Granulocytes: 0 %
Lymphocytes Absolute: 2.3 10*3/uL (ref 0.7–3.1)
Lymphs: 38 %
MCH: 27.9 pg (ref 26.6–33.0)
MCHC: 32.9 g/dL (ref 31.5–35.7)
MCV: 85 fL (ref 79–97)
Monocytes Absolute: 0.5 10*3/uL (ref 0.1–0.9)
Monocytes: 8 %
Neutrophils Absolute: 3.3 10*3/uL (ref 1.4–7.0)
Neutrophils: 53 %
Platelets: 191 10*3/uL (ref 150–450)
RBC: 5.05 x10E6/uL (ref 3.77–5.28)
RDW: 13.2 % (ref 11.7–15.4)
WBC: 6.2 10*3/uL (ref 3.4–10.8)

## 2020-10-02 LAB — COMPREHENSIVE METABOLIC PANEL
ALT: 23 IU/L (ref 0–32)
AST: 21 IU/L (ref 0–40)
Albumin/Globulin Ratio: 1.8 (ref 1.2–2.2)
Albumin: 4.6 g/dL (ref 3.8–4.9)
Alkaline Phosphatase: 91 IU/L (ref 44–121)
BUN/Creatinine Ratio: 17 (ref 9–23)
BUN: 13 mg/dL (ref 6–24)
Bilirubin Total: 0.4 mg/dL (ref 0.0–1.2)
CO2: 21 mmol/L (ref 20–29)
Calcium: 9.4 mg/dL (ref 8.7–10.2)
Chloride: 101 mmol/L (ref 96–106)
Creatinine, Ser: 0.76 mg/dL (ref 0.57–1.00)
Globulin, Total: 2.6 g/dL (ref 1.5–4.5)
Glucose: 96 mg/dL (ref 65–99)
Potassium: 4.6 mmol/L (ref 3.5–5.2)
Sodium: 138 mmol/L (ref 134–144)
Total Protein: 7.2 g/dL (ref 6.0–8.5)
eGFR: 92 mL/min/{1.73_m2} (ref >59)

## 2020-10-02 LAB — LIPID PANEL
Chol/HDL Ratio: 4.3 ratio (ref 0.0–4.4)
Cholesterol, Total: 223 mg/dL — ABNORMAL HIGH (ref 100–199)
HDL: 52 mg/dL (ref >39)
LDL Chol Calc (NIH): 150 mg/dL — ABNORMAL HIGH (ref 0–99)
Triglycerides: 120 mg/dL (ref 0–149)
VLDL Cholesterol Cal: 21 mg/dL (ref 5–40)

## 2020-10-02 LAB — TSH: TSH: 5.22 u[IU]/mL — ABNORMAL HIGH (ref 0.450–4.500)

## 2020-10-02 LAB — VITAMIN D 25 HYDROXY (VIT D DEFICIENCY, FRACTURES): Vit D, 25-Hydroxy: 25.6 ng/mL — ABNORMAL LOW (ref 30.0–100.0)

## 2020-10-28 LAB — SPECIMEN STATUS REPORT

## 2020-10-28 LAB — THYROID PEROXIDASE ANTIBODY: Thyroperoxidase Ab SerPl-aCnc: <8 IU/mL (ref 0–34)

## 2020-10-28 LAB — T3, FREE: T3, Free: 2.9 pg/mL (ref 2.0–4.4)

## 2020-11-11 ENCOUNTER — Telehealth: Payer: Self-pay

## 2020-11-11 NOTE — Telephone Encounter (Signed)
Called patient and appt made

## 2020-11-11 NOTE — Telephone Encounter (Signed)
Copied from [place] [id number]. Topic: Appointment Scheduling - Scheduling Inquiry for Clinic >> Nov 11, 2020  9:23 AM Oneta Rack wrote: Reason for CRM: patient had to [facility] her 11/13/2020 physical appointment and would like to know if there is any cancellations within 3 weeks please reach out.

## 2020-11-13 ENCOUNTER — Encounter: Payer: BC Managed Care – PPO | Admitting: Family Medicine

## 2020-11-14 ENCOUNTER — Other Ambulatory Visit: Payer: Self-pay | Admitting: Family Medicine

## 2020-11-14 DIAGNOSIS — E781 Pure hyperglyceridemia: Secondary | ICD-10-CM

## 2020-11-14 NOTE — Telephone Encounter (Signed)
Requested Prescriptions  Pending Prescriptions Disp Refills  . omega-3 acid ethyl esters (LOVAZA) 1 g capsule [Pharmacy Med Name: Omega-3-acid Ethyl Esters 1 GM Oral Capsule] 360 capsule 0    Sig: Take 2 capsules by mouth twice daily     Endocrinology:  Nutritional Agents Failed - 11/14/2020  9:15 AM      Failed - Valid encounter within last 12 months    Recent Outpatient Visits          1 year ago Well woman exam   [facility] Delsa Grana, PA-C   2 years ago Hypertriglyceridemia   [facility] Steele Sizer, MD   3 years ago Well adult exam   [facility] Fredderick Severance, NP   3 years ago Subacute maxillary sinusitis   [facility] Steele Sizer, MD   4 years ago Well woman exam   [facility] Steele Sizer, MD      Future Appointments            In 3 months Ancil Boozer, Drue Stager, MD [facility], [facility]

## 2021-02-03 ENCOUNTER — Encounter: Payer: BC Managed Care – PPO | Admitting: Family Medicine

## 2021-02-22 ENCOUNTER — Other Ambulatory Visit: Payer: Self-pay | Admitting: Family Medicine

## 2021-02-22 DIAGNOSIS — E781 Pure hyperglyceridemia: Secondary | ICD-10-CM

## 2021-02-22 MED ORDER — OMEGA-3-ACID ETHYL ESTERS 1 G PO CAPS: 2.0000 | ORAL_CAPSULE | Freq: Two times a day (BID) | ORAL | 0 refills | Status: DC

## 2021-02-22 NOTE — Telephone Encounter (Signed)
Requested Prescriptions  Pending Prescriptions Disp Refills  . omega-3 acid ethyl esters (LOVAZA) 1 g capsule 120 capsule 0    Sig: Take 2 capsules (2 g total) by mouth 2 (two) times daily.     Endocrinology:  Nutritional Agents Failed - 02/22/2021  5:14 PM      Failed - Valid encounter within last 12 months    Recent Outpatient Visits          1 year ago Well woman exam   [facility] Delsa Grana, PA-C   2 years ago Hypertriglyceridemia   [facility] Steele Sizer, MD   3 years ago Well adult exam   [facility] Fredderick Severance, NP   3 years ago Subacute maxillary sinusitis   [facility] Steele Sizer, MD   4 years ago Well woman exam   [facility] Steele Sizer, MD      Future Appointments            In 2 weeks Steele Sizer, MD [facility], [facility]

## 2021-02-22 NOTE — Telephone Encounter (Signed)
Copied from [place] [phone]. Topic: Quick Communication - Rx Refill/Question >> Feb 22, 2021  4:45 PM Lenon Curt, Rana Snare A wrote: Medication: omega-3 acid ethyl esters (LOVAZA) 1 g capsule [[id number]]   Has the patient contacted their pharmacy? Yes.  The patient was unable to request the medication successfully. (Agent: If no, request that the patient contact the pharmacy for the refill. If patient does not wish to contact the pharmacy document the reason why and proceed with request.) (Agent: If yes, when and what did the pharmacy advise?)  Preferred Pharmacy (with phone number or street name): [place], [place]  Phone:  [phone] Fax:  [phone]    Has the patient been seen for an appointment in the last year OR does the patient have an upcoming appointment? Yes.    Agent: Please be advised that RX refills may take up to 3 business days. We ask that you follow-up with your pharmacy.

## 2021-03-03 ENCOUNTER — Ambulatory Visit: Payer: BC Managed Care – PPO | Admitting: Family Medicine

## 2021-03-09 NOTE — Progress Notes (Signed)
Name: Autumn Long   MRN: [id number]    DOB: [birth year]   Date:03/10/2021       Progress Note  Subjective  Chief Complaint  Follow Up  HPI  History of traumatic brain injury: happened 03/21, had to be hospitalized she is now under the care of Dr. Ellene Route, she continues to have headache and intermittent dizziness. It happened at work, she is having wednesdays off for accomodation, she had vestibular rehab. She has anosmia, tingling hands and also right leg, and hearing loss she still has some cognitive problems, and also still has some balance problems. The [place]  have granted her 15 percent disability.     Paresthesia on both hands and right leg: since 03/21 after traumatic brain injury, she had nerve conductions studies that were negative , symptoms worse at night, we will try low dose lyrica   Dyslipidemia: she has been taking Lovaza, LDL improved, HDL normal and also triglycerides is back to normal   Subclinical hypothyroidism: last TSH slightly elevated, weight is stable, no dysphagia.   Patient Active Problem List   Diagnosis Date Noted   Body mass index (BMI) 27.0-27.9, adult 03/04/2020   Lumbar radiculopathy 03/04/2020   Anosmia 11/19/2019   BPPV (benign paroxysmal positional vertigo), unspecified laterality 11/19/2019   History of skull fracture 11/19/2019   Sensorineural hearing loss (SNHL) of both ears 11/19/2019   Diffuse traumatic brain injury with loss of consciousness of unspecified duration, initial encounter ([place]) 10/09/2019   Dyslipidemia 10/18/2014   Keratosis pilaris 10/18/2014   Subclinical hypothyroidism 10/18/2014   Vitamin D deficiency 10/18/2014   History of Hashimoto thyroiditis 03/18/2013    Past Surgical History:  Procedure Laterality Date   [place]   COLONOSCOPY WITH PROPOFOL N/A 04/19/2016   Procedure: COLONOSCOPY WITH PROPOFOL;  Surgeon: Lollie Sails, MD;  Location: [facility] ENDOSCOPY;  Service:  Endoscopy;  Laterality: N/A;    Family History  Problem Relation Age of Onset   Heart disease Father    Cancer Maternal Grandmother        Stomach    Social History   Tobacco Use   Smoking status: Never   Smokeless tobacco: Never  Substance Use Topics   Alcohol use: Yes    Alcohol/week: 0.0 standard drinks    Comment: occasionally     Current Outpatient Medications:    cholecalciferol (VITAMIN D) 1000 UNITS tablet, Take 2,000 Units by mouth daily., Disp: , Rfl:    pregabalin (LYRICA) 50 MG capsule, Take 1-3 capsules (50-150 mg total) by mouth at bedtime., Disp: 90 capsule, Rfl: 0   omega-3 acid ethyl esters (LOVAZA) 1 g capsule, Take 2 capsules (2 g total) by mouth 2 (two) times daily., Disp: 360 capsule, Rfl: 3  No Known Allergies  I personally reviewed active problem list, medication list, allergies, family history, social history, health maintenance with the patient/caregiver today.   ROS  Constitutional: Negative for fever or weight change.  Respiratory: Negative for cough and shortness of breath.   Cardiovascular: Negative for chest pain or palpitations.  Gastrointestinal: Negative for abdominal pain, no bowel changes.  Musculoskeletal: Negative for gait problem or joint swelling.  Skin: Negative for rash.  Neurological: positive for intermittent  dizziness or headache.  No other specific complaints in a complete review of systems (except as listed in HPI above).   Objective  Vitals:   03/10/21 1418  BP: 130/72  Pulse: 72  Resp: 16  Temp: 97.8 F (  36.6 C)  TempSrc: Oral  SpO2: 99%  Weight: 160 lb 4.8 oz (72.7 kg)  Height: 5\' 5"  (1.651 m)    Body mass index is 26.68 kg/m.  Physical Exam  Constitutional: Patient appears well-developed and well-nourished.  No distress.  HEENT: head atraumatic, normocephalic, pupils equal and reactive to light, neck supple Cardiovascular: Normal rate, regular rhythm and normal heart sounds.  No murmur heard. No BLE  edema. Pulmonary/Chest: Effort normal and breath sounds normal. No respiratory distress. Abdominal: Soft.  There is no tenderness. Psychiatric: Patient has a normal mood and affect. behavior is normal. Judgment and thought content normal.   PHQ2/9: Depression screen [facility] 2/9 03/10/2021 11/12/2019 11/07/2018 11/03/2017 11/01/2016  Decreased Interest 0 0 0 0 0  Down, Depressed, Hopeless 0 0 0 0 0  PHQ - 2 Score 0 0 0 0 0  Altered sleeping 0 0 0 - -  Tired, decreased energy 0 0 0 - -  Change in appetite 0 0 0 - -  Feeling bad or failure about yourself  0 0 0 - -  Trouble concentrating 0 0 0 - -  Moving slowly or fidgety/restless 0 0 0 - -  Suicidal thoughts 0 0 0 - -  PHQ-9 Score 0 0 0 - -  Difficult doing work/chores Not difficult at all Not difficult at all - - -    phq 9 is negative   Fall Risk: Fall Risk  03/10/2021 11/12/2019 11/07/2018 11/03/2017 10/10/2017  Falls in the past year? 0 1 0 No No  Number falls in past yr: 0 0 0 - -  Injury with Fall? 0 1 0 - -  Risk for fall due to : No Fall Risks - - - -  Follow up Falls prevention discussed Falls evaluation completed - - -      Functional Status Survey: Is the patient deaf or have difficulty hearing?: No Does the patient have difficulty seeing, even when wearing glasses/contacts?: No Does the patient have difficulty concentrating, remembering, or making decisions?: No Does the patient have difficulty walking or climbing stairs?: No Does the patient have difficulty dressing or bathing?: No Does the patient have difficulty doing errands alone such as visiting a doctor's office or shopping?: No    Assessment & Plan  1. Subclinical hypothyroidism  Discussed rechecking it today but she asked to hold off for now   2. Hypertriglyceridemia  - omega-3 acid ethyl esters (LOVAZA) 1 g capsule; Take 2 capsules (2 g total) by mouth 2 (two) times daily.  Dispense: 360 capsule; Refill: 3  3. Vitamin D deficiency   4. Need for influenza  vaccination  - Flu Vaccine QUAD 6+ mos PF IM (Fluarix Quad PF)  5. Pre-diabetes   6. Dyslipidemia  - omega-3 acid ethyl esters (LOVAZA) 1 g capsule; Take 2 capsules (2 g total) by mouth 2 (two) times daily.  Dispense: 360 capsule; Refill: 3  7. History of traumatic brain injury   8. Paresthesia  - pregabalin (LYRICA) 50 MG capsule; Take 1-3 capsules (50-150 mg total) by mouth at bedtime.  Dispense: 90 capsule; Refill: 0

## 2021-03-10 ENCOUNTER — Other Ambulatory Visit: Payer: Self-pay

## 2021-03-10 ENCOUNTER — Encounter: Payer: BC Managed Care – PPO | Admitting: Family Medicine

## 2021-03-10 ENCOUNTER — Ambulatory Visit (INDEPENDENT_AMBULATORY_CARE_PROVIDER_SITE_OTHER): Payer: BC Managed Care – PPO | Admitting: Family Medicine

## 2021-03-10 ENCOUNTER — Encounter: Payer: Self-pay | Admitting: Family Medicine

## 2021-03-10 VITALS — BP 130/72 | HR 72 | Temp 97.8°F | Resp 16 | Ht 65.0 in | Wt 160.3 lb

## 2021-03-10 DIAGNOSIS — Z23 Encounter for immunization: Secondary | ICD-10-CM

## 2021-03-10 DIAGNOSIS — E781 Pure hyperglyceridemia: Secondary | ICD-10-CM

## 2021-03-10 DIAGNOSIS — E559 Vitamin D deficiency, unspecified: Secondary | ICD-10-CM | POA: Diagnosis not present

## 2021-03-10 DIAGNOSIS — R202 Paresthesia of skin: Secondary | ICD-10-CM

## 2021-03-10 DIAGNOSIS — Z87820 Personal history of traumatic brain injury: Secondary | ICD-10-CM

## 2021-03-10 DIAGNOSIS — E038 Other specified hypothyroidism: Secondary | ICD-10-CM | POA: Diagnosis not present

## 2021-03-10 DIAGNOSIS — R7303 Prediabetes: Secondary | ICD-10-CM

## 2021-03-10 DIAGNOSIS — E785 Hyperlipidemia, unspecified: Secondary | ICD-10-CM

## 2021-03-10 MED ORDER — PREGABALIN 50 MG PO CAPS: 50.0000 mg | ORAL_CAPSULE | Freq: Every day | ORAL | 0 refills | Status: DC

## 2021-03-10 MED ORDER — OMEGA-3-ACID ETHYL ESTERS 1 G PO CAPS: 2.0000 | ORAL_CAPSULE | Freq: Two times a day (BID) | ORAL | 3 refills | Status: DC

## 2021-03-10 NOTE — Patient Instructions (Addendum)
Contact insurance to find out if they pay for shingrix   Please start Lyrica at 50 mg at night for 3 nights go up on the dose if no improvement, max of 150 mg

## 2021-04-21 ENCOUNTER — Other Ambulatory Visit (HOSPITAL_COMMUNITY)
Admission: RE | Admit: 2021-04-21 | Discharge: 2021-04-21 | Disposition: A | Discharge status: Home / Self Care | Payer: BC Managed Care – PPO | Source: Ambulatory Visit | Attending: Family Medicine | Admitting: Family Medicine

## 2021-04-21 ENCOUNTER — Encounter: Payer: Self-pay | Admitting: Family Medicine

## 2021-04-21 ENCOUNTER — Ambulatory Visit (INDEPENDENT_AMBULATORY_CARE_PROVIDER_SITE_OTHER): Payer: BC Managed Care – PPO | Admitting: Family Medicine

## 2021-04-21 VITALS — BP 112/74 | HR 64 | Temp 97.6°F | Resp 16 | Ht 65.0 in | Wt 163.3 lb

## 2021-04-21 DIAGNOSIS — Z0000 Encounter for general adult medical examination without abnormal findings: Secondary | ICD-10-CM

## 2021-04-21 DIAGNOSIS — Z1231 Encounter for screening mammogram for malignant neoplasm of breast: Secondary | ICD-10-CM | POA: Diagnosis not present

## 2021-04-21 DIAGNOSIS — Z124 Encounter for screening for malignant neoplasm of cervix: Secondary | ICD-10-CM | POA: Diagnosis not present

## 2021-04-21 DIAGNOSIS — Z23 Encounter for immunization: Secondary | ICD-10-CM

## 2021-04-21 DIAGNOSIS — Z8639 Personal history of other endocrine, nutritional and metabolic disease: Secondary | ICD-10-CM

## 2021-04-21 MED ORDER — SHINGRIX 50 MCG/0.5ML IM SUSR: 0.5000 mL | Freq: Once | INTRAMUSCULAR | 1 refills | Status: AC

## 2021-04-21 NOTE — Patient Instructions (Signed)

## 2021-04-21 NOTE — Progress Notes (Signed)
Name: Keyasha Miah   MRN: [id number]    DOB: [birth year]   Date:04/21/2021       Progress Note  Subjective  Chief Complaint  Annual Exam  HPI  Patient presents for annual CPE.  Diet: cooks at home most of the time, balanced  Exercise: discussed trying to increase physical activity as tolerated    [place] Visit from 04/21/2021 in [facility]  AUDIT-C Score 1      Depression: Phq 9 is  negative Depression screen [facility] 2/9 04/21/2021 03/10/2021 11/12/2019 11/07/2018 11/03/2017  Decreased Interest 0 0 0 0 0  Down, Depressed, Hopeless 0 0 0 0 0  PHQ - 2 Score 0 0 0 0 0  Altered sleeping 0 0 0 0 -  Tired, decreased energy 0 0 0 0 -  Change in appetite 0 0 0 0 -  Feeling bad or failure about yourself  0 0 0 0 -  Trouble concentrating 0 0 0 0 -  Moving slowly or fidgety/restless 0 0 0 0 -  Suicidal thoughts 0 0 0 0 -  PHQ-9 Score 0 0 0 0 -  Difficult doing work/chores Not difficult at all Not difficult at all Not difficult at all - -   Hypertension: BP Readings from Last 3 Encounters:  04/21/21 112/74  03/10/21 130/72  11/12/19 124/76   Obesity: Wt Readings from Last 3 Encounters:  04/21/21 163 lb 4.8 oz (74.1 kg)  03/10/21 160 lb 4.8 oz (72.7 kg)  11/12/19 156 lb 11.2 oz (71.1 kg)   BMI Readings from Last 3 Encounters:  04/21/21 27.17 kg/m  03/10/21 26.68 kg/m  11/12/19 26.08 kg/m     Vaccines:   Shingrix: 33-64 yo and ask insurance if covered when patient above 39 yo Pneumonia: educated and discussed with patient. Flu: educated and discussed with patient.  Hep C Screening: 11/12/19 STD testing and prevention (HIV/chl/gon/syphilis): 07/26/19 Intimate partner violence: negative Sexual History : sexually active no problems  Menstrual History/LMP/Abnormal Bleeding: discussed post-menopausal bleeding  Incontinence Symptoms: no problems   Breast cancer:  - Last Mammogram: 10/22/18   Osteoporosis prevention : Discussed high  calcium and vitamin D supplementation, weight bearing exercises  Cervical cancer screening: Ordered today  Skin cancer: Discussed monitoring for atypical lesions  Colorectal cancer: 04/19/16   Lung cancer: Low Dose CT Chest recommended if Age 30-80 years, 20 pack-year currently smoking OR have quit w/in 15years. Patient does not qualify.   ECG: 07/25/19  Advanced Care Planning: A voluntary discussion about advance care planning including the explanation and discussion of advance directives.  Discussed health care proxy and Living will, and the patient was able to identify a health care proxy as husband   Lipids: Lab Results  Component Value Date   CHOL 223 (H) 10/01/2020   CHOL 235 (H) 11/12/2019   CHOL 176 10/24/2018   Lab Results  Component Value Date   HDL 52 10/01/2020   HDL 53 11/12/2019   HDL 48 10/24/2018   Lab Results  Component Value Date   LDLCALC 150 (H) 10/01/2020   LDLCALC 166 (H) 11/12/2019   LDLCALC 97 10/24/2018   Lab Results  Component Value Date   TRIG 120 10/01/2020   TRIG 92 11/12/2019   TRIG 154 (H) 10/24/2018   Lab Results  Component Value Date   CHOLHDL 4.3 10/01/2020   CHOLHDL 4.4 11/12/2019   CHOLHDL 3.7 10/24/2018   No results found for: LDLDIRECT  Glucose: Glucose  Date Value Ref  Range Status  10/01/2020 96 65 - 99 mg/dL Final  11/12/2019 94 65 - 99 mg/dL Final  10/24/2018 90 65 - 99 mg/dL Final   Glucose, Bld  Date Value Ref Range Status  07/25/2019 139 (H) 70 - 99 mg/dL Final    Comment:    Glucose reference range applies only to samples taken after fasting for at least 8 hours.    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, initial encounter (Hendrix) 10/09/2019   Dyslipidemia 10/18/2014   Keratosis pilaris 10/18/2014   Subclinical hypothyroidism 10/18/2014   Vitamin D deficiency 10/18/2014   History of Hashimoto thyroiditis 03/18/2013    Past Surgical History:  Procedure Laterality Date   [place]   COLONOSCOPY WITH PROPOFOL N/A 04/19/2016   Procedure: COLONOSCOPY WITH PROPOFOL;  Surgeon: Lollie Sails, MD;  Location: [facility] ENDOSCOPY;  Service: Endoscopy;  Laterality: N/A;    Family History  Problem Relation Age of Onset   Heart disease Father    Cancer Maternal Grandmother        Stomach    Social History   Socioeconomic History   Marital status: Married    Spouse name: Corda Shutt   Number of children: 2   Years of education: 62   Highest education level: Bachelor's degree (e.g., BA, AB, BS)  Occupational History   Not on file  Tobacco Use   Smoking status: Never   Smokeless tobacco: Never  Vaping Use   Vaping Use: Never used  Substance and Sexual Activity   Alcohol use: Yes    Alcohol/week: 0.0 standard drinks    Comment: occasionally   Drug use: No   Sexual activity: Yes    Partners: Male  Other Topics Concern   Not on file  Social History Narrative   Teaches Math   Social Determinants of Health   Financial Resource Strain: Low Risk    Difficulty of Paying Living Expenses: Not hard at all  Food Insecurity: No Food Insecurity   Worried About Charity fundraiser in the Last Year: Never true   Arboriculturist in the Last Year: Never true  Transportation Needs: No Transportation Needs   Lack of Transportation (Medical): No   Lack of Transportation (Non-Medical): No  Physical Activity: Insufficiently Active   Days of Exercise per Week: 3 days   Minutes of Exercise per Session: 20 min  Stress: No Stress Concern Present   Feeling of Stress : Not at all   Social Connections: Socially Isolated   Frequency of Communication with Friends and Family: Once a week   Frequency of Social Gatherings with Friends and Family: Once a week   Attends Religious Services: Never   Marine scientist or Organizations: No   Attends Music therapist: Never   Marital Status: Married  Human resources officer Violence: Not At Risk   Fear of Current or Ex-Partner: No   Emotionally Abused: No   Physically Abused: No   Sexually Abused: No     Current Outpatient Medications:    cholecalciferol (VITAMIN D) 1000 UNITS tablet, Take 2,000  Units by mouth daily., Disp: , Rfl:    omega-3 acid ethyl esters (LOVAZA) 1 g capsule, Take 2 capsules (2 g total) by mouth 2 (two) times daily., Disp: 360 capsule, Rfl: 3   Zoster Vaccine Adjuvanted (SHINGRIX) injection, Inject 0.5 mLs into the muscle once for 1 dose., Disp: 0.5 mL, Rfl: 1   pregabalin (LYRICA) 50 MG capsule, Take 1-3 capsules (50-150 mg total) by mouth at bedtime. (Patient not taking: Reported on 04/21/2021), Disp: 90 capsule, Rfl: 0  No Known Allergies   ROS  Constitutional: Negative for fever or weight change.  Respiratory: Negative for cough and shortness of breath.   Cardiovascular: Negative for chest pain or palpitations.  Gastrointestinal: Negative for abdominal pain, no bowel changes.  Musculoskeletal: Negative for gait problem or joint swelling.  Skin: Negative for rash.  Neurological: Negative for dizziness or headache.  No other specific complaints in a complete review of systems (except as listed in HPI above).   Objective  Vitals:   04/21/21 1332  BP: 112/74  Pulse: 64  Resp: 16  Temp: 97.6 F (36.4 C)  TempSrc: Oral  SpO2: 99%  Weight: 163 lb 4.8 oz (74.1 kg)  Height: 5\' 5"  (1.651 m)    Body mass index is 27.17 kg/m.  Physical Exam  Constitutional: Patient appears well-developed and well-nourished. No distress.  HENT: Head: Normocephalic and atraumatic. Ears: B TMs  ok, no erythema or effusion; Nose: Not done Mouth/Throat: not done  Eyes: Conjunctivae and EOM are normal. Pupils are equal, round, and reactive to light. No scleral icterus.  Neck: Normal range of motion. Neck supple. No JVD present. No thyromegaly present.  Cardiovascular: Normal rate, regular rhythm and normal heart sounds.  No murmur heard. No BLE edema. Pulmonary/Chest: Effort normal and breath sounds normal. No respiratory distress. Abdominal: Soft. Bowel sounds are normal, no distension. There is no tenderness. no masses Breast: no lumps or masses, no nipple discharge or rashes FEMALE GENITALIA:  External genitalia normal External urethra normal Vaginal vault normal without discharge or lesions Cervix : friable  Bimanual exam normal without masses RECTAL:no done  Musculoskeletal: Normal range of motion, no joint effusions. No gross deformities Neurological: he is alert and oriented to person, place, and time. No cranial nerve deficit. Coordination, balance, strength, speech and gait are normal.  Skin: Skin is warm and dry. No rash noted. No erythema.  Psychiatric: Patient has a normal mood and affect. behavior is normal. Judgment and thought content normal.    Fall Risk: Fall Risk  04/21/2021 03/10/2021 11/12/2019 11/07/2018 11/03/2017  Falls in the past year? 0 0 1 0 No  Number falls in past yr: 0 0 0 0 -  Injury with Fall? 0 0 1 0 -  Risk for fall due to : No Fall Risks No Fall Risks - - -  Follow up Falls prevention discussed Falls prevention discussed Falls evaluation completed - -     Functional Status Survey: Is the patient deaf or have difficulty hearing?: No Does the patient have difficulty seeing, even when wearing glasses/contacts?: No Does the patient have difficulty concentrating, remembering, or making decisions?: No Does the patient have difficulty walking or climbing stairs?: No Does the patient have difficulty dressing or bathing?: No Does the patient have  difficulty doing errands alone such as visiting a doctor's office or shopping?: No   Assessment & Plan  1. Well adult exam   2. Cervical cancer screening  - Cytology - PAP  3. Need for Tdap  vaccination  - Tdap vaccine greater than or equal to 7yo IM  4. Need for shingles vaccine  - Zoster Vaccine Adjuvanted [facility]) injection; Inject 0.5 mLs into the muscle once for 1 dose.  Dispense: 0.5 mL; Refill: 1  5. Encounter for screening mammogram for breast cancer  Up to date   5. History of Hashimoto thyroiditis  - TSH - T3   -USPSTF grade A and B recommendations reviewed with patient; age-appropriate recommendations, preventive care, screening tests, etc discussed and encouraged; healthy living encouraged; see AVS for patient education given to patient -Discussed importance of 150 minutes of physical activity weekly, eat two servings of fish weekly, eat one serving of tree nuts ( cashews, pistachios, pecans, almonds.Marland Kitchen) every other day, eat 6 servings of fruit/vegetables daily and drink plenty of water and avoid sweet beverages.

## 2021-04-22 ENCOUNTER — Ambulatory Visit (INDEPENDENT_AMBULATORY_CARE_PROVIDER_SITE_OTHER): Payer: BC Managed Care – PPO

## 2021-04-22 DIAGNOSIS — Z23 Encounter for immunization: Secondary | ICD-10-CM | POA: Diagnosis not present

## 2021-04-23 LAB — T3: T3, Total: 87 ng/dL (ref 71–180)

## 2021-04-23 LAB — TSH: TSH: 4.72 u[IU]/mL — ABNORMAL HIGH (ref 0.450–4.500)

## 2021-04-28 LAB — CYTOLOGY - PAP
Adequacy: ABSENT
Comment: NEGATIVE
Diagnosis: NEGATIVE
High risk HPV: NEGATIVE

## 2021-05-12 ENCOUNTER — Ambulatory Visit: Payer: BC Managed Care – PPO | Admitting: Physician Assistant

## 2021-05-12 ENCOUNTER — Encounter: Payer: Self-pay | Admitting: Physician Assistant

## 2021-05-12 VITALS — BP 118/70 | HR 61 | Temp 98.5°F | Resp 16 | Ht 65.0 in | Wt 161.9 lb

## 2021-05-12 DIAGNOSIS — M545 Low back pain, unspecified: Secondary | ICD-10-CM | POA: Diagnosis not present

## 2021-05-12 DIAGNOSIS — N3001 Acute cystitis with hematuria: Secondary | ICD-10-CM

## 2021-05-12 LAB — POCT URINALYSIS DIPSTICK
Glucose, UA: NEGATIVE
Ketones, UA: NEGATIVE
Nitrite, UA: NEGATIVE
Odor: NORMAL
Protein, UA: NEGATIVE
Spec Grav, UA: 1.02 (ref 1.010–1.025)
Urobilinogen, UA: 0.2 E.U./dL
pH, UA: 5 (ref 5.0–8.0)

## 2021-05-12 MED ORDER — CIPROFLOXACIN HCL 500 MG PO TABS: 500.0000 mg | ORAL_TABLET | Freq: Two times a day (BID) | ORAL | 0 refills | Status: AC

## 2021-05-12 NOTE — Progress Notes (Signed)
Acute Office Visit  Subjective:    Patient ID: Autumn Long, female    DOB: 02/07/1966, 56 y.o.   MRN: [id number]  Introduced myself to the patient as a PA-C and provided education on APPs in clinical practice.    Chief Complaint  Patient presents with   Back Pain    Lower left side onset for several weeks. Pt states is tender and sometimes feels in the groin area.    HPI Patient is in today for lower left side back and side pain with radiation into left groin   Has not taken ibuprofen recently but it did help with side pain when it first started Denies aggravating triggers  States she walks every day without irritation   Dull pain today - improved since it started Denies throbbing, piercing sensation Denies dysuria, urinary urgency/ frequency or smell, denies hematuria   Fall in 2021 with head injury - residual tingling in hands and left leg  Past Medical History:  Diagnosis Date   Hematuria    Hyperlipidemia    Hypothyroidism    Keratosis pilaris    Vitamin D deficiency     Past Surgical History:  Procedure Laterality Date   [place]   COLONOSCOPY WITH PROPOFOL N/A 04/19/2016   Procedure: COLONOSCOPY WITH PROPOFOL;  Surgeon: Lollie Sails, MD;  Location: [facility] ENDOSCOPY;  Service: Endoscopy;  Laterality: N/A;    Family History  Problem Relation Age of Onset   Heart disease Father    Cancer Maternal Grandmother        Stomach    Social History   Socioeconomic History   Marital status: Married    Spouse name: Jaeli Grubb   Number of children: 2   Years of education: 41   Highest education level: Bachelor's degree (e.g., BA, AB, BS)  Occupational History   Not on file  Tobacco Use   Smoking status: Never   Smokeless tobacco: Never  Vaping Use   Vaping Use: Never used  Substance and Sexual Activity   Alcohol use: Yes    Alcohol/week: 0.0 standard drinks    Comment: occasionally   Drug use:  No   Sexual activity: Yes    Partners: Male  Other Topics Concern   Not on file  Social History Narrative   Teaches Math   Social Determinants of Health   Financial Resource Strain: Low Risk    Difficulty of Paying Living Expenses: Not hard at all  Food Insecurity: No Food Insecurity   Worried About Charity fundraiser in the Last Year: Never true   Arboriculturist in the Last Year: Never true  Transportation Needs: No Transportation Needs   Lack of Transportation (Medical): No   Lack of Transportation (Non-Medical): No  Physical Activity: Insufficiently Active   Days of Exercise per Week: 3 days   Minutes of Exercise per Session: 20 min  Stress: No Stress Concern Present   Feeling of Stress : Not at all  Social Connections: Socially Isolated   Frequency of Communication with Friends and Family: Once a week   Frequency of Social Gatherings with Friends and Family: Once a week   Attends Religious Services: Never   Marine scientist or Organizations: No   Attends Archivist Meetings: Never   Marital Status: Married  Human resources officer Violence: Not At Risk   Fear of Current or Ex-Partner: No   Emotionally Abused: No  Physically Abused: No   Sexually Abused: No    Outpatient Medications Prior to Visit  Medication Sig Dispense Refill   cholecalciferol (VITAMIN D) 1000 UNITS tablet Take 2,000 Units by mouth daily.     omega-3 acid ethyl esters (LOVAZA) 1 g capsule Take 2 capsules (2 g total) by mouth 2 (two) times daily. 360 capsule 3   pregabalin (LYRICA) 50 MG capsule Take 1-3 capsules (50-150 mg total) by mouth at bedtime. (Patient not taking: Reported on 04/21/2021) 90 capsule 0   No facility-administered medications prior to visit.    No Known Allergies  Review of Systems  Genitourinary:  Negative for difficulty urinating, dysuria, frequency, hematuria and urgency.  Musculoskeletal:  Positive for back pain. Negative for neck pain and neck stiffness.   Neurological:  Positive for numbness (chronic from remote head injury). Negative for dizziness, speech difficulty and light-headedness.      Objective:    Physical Exam Constitutional:      Appearance: Normal appearance. She is normal weight.  HENT:     Head: Normocephalic and atraumatic.  Eyes:     Extraocular Movements: Extraocular movements intact.  Cardiovascular:     Rate and Rhythm: Normal rate and regular rhythm.     Pulses: Normal pulses.     Heart sounds: Normal heart sounds.  Pulmonary:     Effort: Pulmonary effort is normal.     Breath sounds: Normal breath sounds.  Abdominal:     General: Abdomen is flat. A surgical scar is present.     Palpations: Abdomen is soft.     Tenderness: There is abdominal tenderness in the suprapubic area and left lower quadrant. There is no right CVA tenderness or left CVA tenderness.  Musculoskeletal:     Cervical back: Normal range of motion and neck supple.     Thoracic back: Normal.     Lumbar back: Negative right straight leg raise test and negative left straight leg raise test.       Back:  Neurological:     Mental Status: She is alert.  Psychiatric:        Mood and Affect: Mood normal.        Behavior: Behavior normal.    BP 118/70    Pulse 61    Temp 98.5 F (36.9 C) (Oral)    Resp 16    Ht _0  (1.651 m)    Wt 161 lb 14.4 oz (73.4 kg)    SpO2 99%    BMI 26.94 kg/m  Wt Readings from Last 3 Encounters:  05/12/21 161 lb 14.4 oz (73.4 kg)  04/21/21 163 lb 4.8 oz (74.1 kg)  03/10/21 160 lb 4.8 oz (72.7 kg)    Health Maintenance Due  Topic Date Due   Pneumococcal Vaccine 47-2 Years old (1 - PCV) Never done   COVID-19 Vaccine (3 - Pfizer risk series) 08/17/2019    There are no preventive care reminders to display for this patient.   Lab Results  Component Value Date   TSH 4.720 (H) 04/22/2021   Lab Results  Component Value Date   WBC 6.2 10/01/2020   HGB 14.1 10/01/2020   HCT 42.9 10/01/2020   MCV 85  10/01/2020   PLT 191 10/01/2020   Lab Results  Component Value Date   NA 138 10/01/2020   K 4.6 10/01/2020   CO2 21 10/01/2020   GLUCOSE 96 10/01/2020   BUN 13 10/01/2020   CREATININE 0.76 10/01/2020   BILITOT 0.4 10/01/2020  ALKPHOS 91 10/01/2020   AST 21 10/01/2020   ALT 23 10/01/2020   PROT 7.2 10/01/2020   ALBUMIN 4.6 10/01/2020   CALCIUM 9.4 10/01/2020   ANIONGAP 10 07/25/2019   EGFR 92 10/01/2020   Lab Results  Component Value Date   CHOL 223 (H) 10/01/2020   Lab Results  Component Value Date   HDL 52 10/01/2020   Lab Results  Component Value Date   LDLCALC 150 (H) 10/01/2020   Lab Results  Component Value Date   TRIG 120 10/01/2020   Lab Results  Component Value Date   CHOLHDL 4.3 10/01/2020   Lab Results  Component Value Date   HGBA1C 5.6 10/01/2020       Assessment & Plan:   Problem List Items Addressed This Visit       Genitourinary   Acute cystitis with hematuria - Primary    Acute, potentially complicated due to presence of flank pain  Patient denies gross hematuria, dysuria, urinary frequency/ urgency, incomplete voiding, fevers at time of apt. Urinalysis positive for blood, leukocytes, cloudy appearance consistent with UTI , culture sent for ID and susceptibility Ciprofloxacin 500 mg PO BID x7 days sent due to presence of flank pain suggestive of potential complicated cystitis Culture to dictate further management if needed.       Relevant Medications   ciprofloxacin (CIPRO) 500 MG tablet   Other Relevant Orders   POCT Urinalysis Dipstick (Completed)   Urine Culture   Other Visit Diagnoses     Left low back pain, unspecified chronicity, unspecified whether sciatica present       Relevant Orders   POCT Urinalysis Dipstick (Completed)   Urine Culture      Problem List Items Addressed This Visit       Genitourinary   Acute cystitis with hematuria - Primary    Acute, potentially complicated due to presence of flank pain   Patient denies gross hematuria, dysuria, urinary frequency/ urgency, incomplete voiding, fevers at time of apt. Urinalysis positive for blood, leukocytes, cloudy appearance consistent with UTI , culture sent for ID and susceptibility Ciprofloxacin 500 mg PO BID x7 days sent due to presence of flank pain suggestive of potential complicated cystitis Culture to dictate further management if needed.       Relevant Medications   ciprofloxacin (CIPRO) 500 MG tablet   Other Relevant Orders   POCT Urinalysis Dipstick (Completed)   Urine Culture   Other Visit Diagnoses     Left low back pain, unspecified chronicity, unspecified whether sciatica present     Left flank pain with radiation to left groin - mild improvement since she made apt Responding to ibuprofen Potentially flank pain related to cystitis - ABX ordered for management Provided at-home management instructions for low back pain in addition to treating for cystitis Recommend Follow up if not resolving    Relevant Orders   POCT Urinalysis Dipstick (Completed)   Urine Culture        No follow-ups on file.   I, Aynslee Mulhall E Ahtziry Saathoff, PA-C, have reviewed all documentation for this visit. The documentation on 05/12/21 for the exam, diagnosis, procedures, and orders are all accurate and complete.   Keymiah Lyles, Dani Gobble, PA-C MPH [facility]    Meds ordered this encounter  Medications   ciprofloxacin (CIPRO) 500 MG tablet    Sig: Take 1 tablet (500 mg total) by mouth 2 (two) times daily for 7 days.    Dispense:  14 tablet  Refill:  0     Shavaun Osterloh E Nadir Vasques, PA-C

## 2021-05-12 NOTE — Patient Instructions (Addendum)
It was nice to meet you and I appreciate the opportunity to be involved in your care   Today I checked your back and did a Urinalysis to check if you have a UTI or blood in your urine. This came back positive for signs of bacteria and blood which makes me suspicious for a potential UTI I have sent a sample to the lab for culturing to help determine what kind of bacteria may be causing this but this can take a few days to return.   In the meantime I am sending in a prescription for Ciprofloxacin 500 mg to be taken by mouth twice per day for the next 7 days Please let us know if you have any questions or concerns while taking this.  Please stay well hydrated, use probiotics and yogurt while taking an antibiotic to help reduce potential diarrhea and abdominal discomfort that can accompany antibiotic use.   I am also giving you some stretches to do to help with your back and side pain  These are good to prevent muscle stiffness and preserve mobility  You can take Ibuprofen according to the instructions included with the medication for pain management - be sure to drink plenty of water while using it.  Warm compresses can also be beneficial to help with muscle tightness and soreness (20 minutes of warm compress then remove for at least 30 minutes)

## 2021-05-12 NOTE — Assessment & Plan Note (Signed)
Acute, potentially complicated due to presence of flank pain  Patient denies gross hematuria, dysuria, urinary frequency/ urgency, incomplete voiding, fevers at time of apt. Urinalysis positive for blood, leukocytes, cloudy appearance consistent with UTI , culture sent for ID and susceptibility Ciprofloxacin 500 mg PO BID x7 days sent due to presence of flank pain suggestive of potential complicated cystitis Culture to dictate further management if needed.

## 2021-05-13 LAB — URINE CULTURE
MICRO NUMBER:: 12857876
SPECIMEN QUALITY:: ADEQUATE

## 2021-05-19 ENCOUNTER — Ambulatory Visit: Payer: Self-pay

## 2021-05-19 NOTE — Telephone Encounter (Signed)
Appt scheduled with Dr Ancil Boozer at 7:70 for 1.19.2023

## 2021-05-19 NOTE — Progress Notes (Signed)
Name: Autumn Long   MRN: [id number]    DOB: [birth year]   Date:05/20/2021       Progress Note  Subjective  Chief Complaint  Not Feeling Better  HPI  Left groin pain :  she states pain is aching pain and started a few weeks ago , it is intermittent. Aggravated by flexion of her left hip. She has also noticed some left lower back pain but is less often and does not seem to radiate as band, but can happen at the same time and LLQ pain, she also has a pain described as numbness that goes down to left anterior thigh, she is also feeling a lump on left groin. She is post-menopausal and denies vaginal discharge or bleeding, still married and same sexual partner . No hematuria, no dysuria, increase in urinary frequency. She was seen here  last week and was given Cipro however symptoms have not improved. She states pain worse when laying on left lateral decubitus and less when on the right side . Stretching exercises seems to help . 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, initial encounter ([place]) 10/09/2019   Dyslipidemia 10/18/2014   Keratosis pilaris 10/18/2014   Subclinical hypothyroidism 10/18/2014   Vitamin D deficiency 10/18/2014   History of Hashimoto thyroiditis 03/18/2013    Past Surgical History:  Procedure Laterality Date   [place]   COLONOSCOPY WITH PROPOFOL N/A 04/19/2016   Procedure: COLONOSCOPY WITH PROPOFOL;  Surgeon: Lollie Sails, MD;  [facility];   Service: Endoscopy;  Laterality: N/A;    Family History  Problem Relation Age of Onset   Heart disease Father    Cancer Maternal Grandmother        Stomach    Social History   Tobacco Use   Smoking status: Never   Smokeless tobacco: Never  Substance Use Topics   Alcohol use: Yes    Alcohol/week: 0.0 standard drinks    Comment: occasionally     Current Outpatient Medications:    cholecalciferol (VITAMIN D) 1000 UNITS tablet, Take 2,000 Units by mouth daily., Disp: , Rfl:    omega-3 acid ethyl esters (LOVAZA) 1 g capsule, Take 2 capsules (2 g total) by mouth 2 (two) times daily., Disp: 360 capsule, Rfl: 3   pregabalin (LYRICA) 50 MG capsule, Take 1-3 capsules (50-150 mg total) by mouth at bedtime., Disp: 90 capsule, Rfl: 0  No Known Allergies  I personally reviewed active problem list, medication list, allergies, family history, social history, health maintenance with the patient/caregiver today.   ROS  Ten systems reviewed and is negative except as mentioned in HPI   Objective  Vitals:   05/20/21 0730  BP: 110/68  Pulse: 80  Resp: 16  Temp: 98.1 F (36.7 C)  SpO2: 99%  Weight: 162 lb (73.5 kg)  Height: 5\' 5"  (1.651 m)    Body mass index is 26.96 kg/m.  Physical Exam  Constitutional: Patient appears well-developed and well-nourished.  No distress.  HEENT: head atraumatic, normocephalic, pupils equal and  reactive to light,neck supple Cardiovascular: Normal rate, regular rhythm and normal heart sounds.  No murmur heard. No BLE edema. Pulmonary/Chest: Effort normal and breath sounds normal. No respiratory distress. Abdominal: Soft.  There is no tenderness.normal bowel sounds, negative CVA tenderness . Pain during palpation of left groin - seems to be a femoral hernia , no bulging. No redness, pain was present at first but after further exam it could not be reproduced.  Muscular Skeletal: pain during palpation of left sacroiliac joint, normal rom of both hips, no  pain during palpation of spine or para spinal muscles , paresthesia intermittent on left anterior thigh  Psychiatric: Patient has a normal mood and affect. behavior is normal. Judgment and thought content normal.   Recent Results (from the past 2160 hour(s))  Cytology - PAP     Status: None   Collection Time: 04/21/21  1:52 PM  Result Value Ref Range   High risk HPV Negative    Adequacy      Satisfactory for evaluation; transformation zone component ABSENT.   Diagnosis      - Negative for intraepithelial lesion or malignancy (NILM)   Comment Normal Reference Range HPV - Negative   TSH     Status: Abnormal   Collection Time: 04/22/21 10:02 AM  Result Value Ref Range   TSH 4.720 (H) 0.450 - 4.500 uIU/mL  T3     Status: None   Collection Time: 04/22/21 10:02 AM  Result Value Ref Range   T3, Total 87 71 - 180 ng/dL  POCT Urinalysis Dipstick     Status: Abnormal   Collection Time: 05/12/21  8:37 AM  Result Value Ref Range   Color, UA Yellow    Clarity, UA Cloudy    Glucose, UA Negative Negative   Bilirubin, UA Small    Ketones, UA Negative    Spec Grav, UA 1.020 1.010 - 1.025   Blood, UA Moderate    pH, UA 5.0 5.0 - 8.0   Protein, UA Negative Negative   Urobilinogen, UA 0.2 0.2 or 1.0 E.U./dL   Nitrite, UA Negative    Leukocytes, UA Moderate (2+) (A) Negative   Appearance Yellow    Odor Normal   Urine Culture     Status: None   Collection Time: 05/12/21  9:00 AM   Specimen: Urine  Result Value Ref Range   MICRO NUMBER: [id number]    SPECIMEN QUALITY: Adequate    Sample Source URINE    STATUS: FINAL    ISOLATE 1:      Mixed genital flora isolated. These superficial bacteria are not indicative of a urinary tract infection. No further organism identification is warranted on this specimen. If clinically indicated, recollect clean-catch, mid-stream urine and transfer  immediately to Urine Culture Transport Tube.      PHQ2/9: Depression screen [facility] 2/9 05/20/2021 05/12/2021  04/21/2021 03/10/2021 11/12/2019  Decreased Interest 0 0 0 0 0  Down, Depressed, Hopeless 0 0 0 0 0  PHQ - 2 Score 0 0 0 0 0  Altered sleeping 0 0 0 0 0  Tired, decreased energy 0 0 0 0 0  Change in appetite 0 0 0 0 0  Feeling bad or failure about yourself  0 0 0 0 0  Trouble concentrating 0 0 0 0 0  Moving slowly or fidgety/restless 0 0 0 0 0  Suicidal thoughts 0 0 0 0 0  PHQ-9 Score 0 0 0 0 0  Difficult doing work/chores - Not difficult at  all Not difficult at all Not difficult at all Not difficult at all    phq 9 is negative   Fall Risk: Fall Risk  05/20/2021 05/12/2021 04/21/2021 03/10/2021 11/12/2019  Falls in the past year? 0 0 0 0 1  Number falls in past yr: 0 0 0 0 0  Injury with Fall? 0 0 0 0 1  Risk for fall due to : No Fall Risks No Fall Risks No Fall Risks No Fall Risks -  Follow up Falls prevention discussed Falls prevention discussed Falls prevention discussed Falls prevention discussed Falls evaluation completed      Functional Status Survey: Is the patient deaf or have difficulty hearing?: No Does the patient have difficulty seeing, even when wearing glasses/contacts?: No Does the patient have difficulty concentrating, remembering, or making decisions?: No Does the patient have difficulty walking or climbing stairs?: No Does the patient have difficulty dressing or bathing?: No Does the patient have difficulty doing errands alone such as visiting a doctor's office or shopping?: No    Assessment & Plan  1. Left groin pain  - US Pelvis Complete; Future - meloxicam (MOBIC) 15 MG tablet; Take 1 tablet (15 mg total) by mouth daily.  Dispense: 30 tablet; Refill: 0  If negative for hernias we will refer her to sports medicine If positive we will refer her to surgeon   2. Paresthesia of left leg  Take Lyrica , she never started medication  3. Pain of left sacroiliac joint  - meloxicam (MOBIC) 15 MG tablet; Take 1 tablet (15 mg total) by mouth daily.  Dispense:  30 tablet; Refill: 0

## 2021-05-19 NOTE — Telephone Encounter (Signed)
Summary: patient not feeling any better need appointment   Patient call in to get an appointment with [name] but nothing available she is feeling no better. Please advise patient is available on 05/20/21 in the morning.   Ph# [phone]             Left message to call back about symptoms.

## 2021-05-19 NOTE — Telephone Encounter (Signed)
° °  Chief Complaint: Lower abdominal discomfort, small lump noted near vaginal opening. Symptoms: Above Frequency: Never "completely went away after antibiotics." Pertinent Negatives: Patient denies Urinary burning. Disposition: [] ED /[] Urgent Care (no appt availability in office) / [] Appointment(In office/virtual)/ []  [place] [facility]/ [] Home Care/ [] Refused Recommended Disposition /[] [place] [facility]/ []  Follow-up with PCP Additional Notes: Has an appointment 05/21/21. Would like to be worked in 05/20/21 if possible. Please advise.   Answer Assessment - Initial Assessment Questions 1. ANTIBIOTIC: "What antibiotic are you taking?" "How many times per day?"     Cipro 2. DURATION: "When was the antibiotic started?"     Last week 3. MAIN SYMPTOM: "What is the main symptom you are concerned about?"     Pain and lump inner vaginal area 4. FEVER: "Do you have a fever?" If Yes, ask: "What is it, how was it measured, and when did it start?"     No 5. OTHER SYMPTOMS: "Do you have any other symptoms?" (e.g., flank pain, vaginal discharge, blood in urine)     No  Protocols used: Urinary Tract Infection on Antibiotic Follow-up Call - [facility]

## 2021-05-20 ENCOUNTER — Encounter: Payer: Self-pay | Admitting: Family Medicine

## 2021-05-20 ENCOUNTER — Other Ambulatory Visit: Payer: Self-pay

## 2021-05-20 ENCOUNTER — Telehealth: Payer: Self-pay

## 2021-05-20 ENCOUNTER — Ambulatory Visit: Payer: BC Managed Care – PPO | Admitting: Family Medicine

## 2021-05-20 VITALS — BP 110/68 | HR 80 | Temp 98.1°F | Resp 16 | Ht 65.0 in | Wt 162.0 lb

## 2021-05-20 DIAGNOSIS — R202 Paresthesia of skin: Secondary | ICD-10-CM | POA: Diagnosis not present

## 2021-05-20 DIAGNOSIS — M533 Sacrococcygeal disorders, not elsewhere classified: Secondary | ICD-10-CM | POA: Diagnosis not present

## 2021-05-20 DIAGNOSIS — R1032 Left lower quadrant pain: Secondary | ICD-10-CM

## 2021-05-20 MED ORDER — MELOXICAM 15 MG PO TABS: 15.0000 mg | ORAL_TABLET | Freq: Every day | ORAL | 0 refills | Status: DC

## 2021-05-20 NOTE — Telephone Encounter (Signed)
Copied from [place] [id number]. Topic: Referral - Status >> May 20, 2021  3:06 PM Yvette Rack wrote: Reason for CRM: Pt requests call back regarding referral for an ultrasound. Pt stated she needs to be called back today if possible. Cb# [phone]

## 2021-05-20 NOTE — Telephone Encounter (Signed)
Pt states the cost out of pocket is too high for her [place] to get done at [facility]. She called her insurance and her insurance suggested to get it done at [facility]. Could you get her order changed for location? Pt stated she has canceled her [place] at [facility].

## 2021-05-21 ENCOUNTER — Ambulatory Visit: Payer: BC Managed Care – PPO | Admitting: Family Medicine

## 2021-05-24 ENCOUNTER — Ambulatory Visit: Payer: BC Managed Care – PPO

## 2021-05-24 ENCOUNTER — Other Ambulatory Visit: Payer: Self-pay

## 2021-05-24 DIAGNOSIS — R1032 Left lower quadrant pain: Secondary | ICD-10-CM

## 2021-05-24 NOTE — Telephone Encounter (Signed)
Spoke with patient and she had talked to her insurance company and discovered [employer] and [employer] [facility] was covered and much cheaper. Referral placed for [place].

## 2021-05-28 ENCOUNTER — Encounter: Payer: BC Managed Care – PPO | Admitting: Family Medicine

## 2021-06-14 ENCOUNTER — Encounter: Payer: Self-pay | Admitting: Family Medicine

## 2021-06-14 ENCOUNTER — Other Ambulatory Visit: Payer: Self-pay

## 2021-06-14 ENCOUNTER — Ambulatory Visit: Payer: BC Managed Care – PPO | Admitting: Family Medicine

## 2021-06-14 VITALS — BP 126/68 | HR 73 | Resp 16 | Ht 63.0 in | Wt 160.0 lb

## 2021-06-14 DIAGNOSIS — R300 Dysuria: Secondary | ICD-10-CM | POA: Diagnosis not present

## 2021-06-14 LAB — POCT URINALYSIS DIPSTICK
Bilirubin, UA: NEGATIVE
Blood, UA: NEGATIVE
Glucose, UA: NEGATIVE
Ketones, UA: NEGATIVE
Leukocytes, UA: NEGATIVE
Nitrite, UA: NEGATIVE
Protein, UA: NEGATIVE
Spec Grav, UA: 1.015 (ref 1.010–1.025)
Urobilinogen, UA: 0.2 E.U./dL
pH, UA: 6 (ref 5.0–8.0)

## 2021-06-14 MED ORDER — PREMARIN 0.625 MG/GM VA CREA: 1.0000 | TOPICAL_CREAM | Freq: Every day | VAGINAL | 0 refills | Status: DC

## 2021-06-14 MED ORDER — NITROFURANTOIN MONOHYD MACRO 100 MG PO CAPS: 100.0000 mg | ORAL_CAPSULE | Freq: Two times a day (BID) | ORAL | 0 refills | Status: DC

## 2021-06-14 NOTE — Progress Notes (Signed)
Name: Autumn Long   MRN: [id number]    DOB: 07/08/1965   Date:06/14/2021       Progress Note  Subjective  Chief Complaint  UTI  HPI  Dysuria: started a few days ago, dysuria , frequency and low back pain. She increased water intake and also drank cranberry juice and low back pain has improved but still has dysuria. Normal in color. No fever , nausea or vomiting. 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, initial encounter ([place]) 10/09/2019   Dyslipidemia 10/18/2014   Keratosis pilaris 10/18/2014   Subclinical hypothyroidism 10/18/2014   Vitamin D deficiency 10/18/2014   History of Hashimoto thyroiditis 03/18/2013    Past Surgical History:  Procedure Laterality Date   [place]   COLONOSCOPY WITH PROPOFOL N/A 04/19/2016   Procedure: COLONOSCOPY WITH PROPOFOL;  Surgeon: Lollie Sails, MD;  Location: [facility] ENDOSCOPY;  Service: Endoscopy;  Laterality: N/A;    Family History  Problem Relation Age of Onset   Heart disease Father    Cancer Maternal Grandmother        Stomach    Social History   Tobacco Use   Smoking status: Never   Smokeless tobacco: Never  Substance Use Topics   Alcohol use: Yes    Alcohol/week: 0.0 standard drinks    Comment: occasionally     Current Outpatient Medications:    cholecalciferol (VITAMIN D) 1000 UNITS tablet, Take 2,000 Units by mouth daily.,  Disp: , Rfl:    meloxicam (MOBIC) 15 MG tablet, Take 1 tablet (15 mg total) by mouth daily., Disp: 30 tablet, Rfl: 0   omega-3 acid ethyl esters (LOVAZA) 1 g capsule, Take 2 capsules (2 g total) by mouth 2 (two) times daily., Disp: 360 capsule, Rfl: 3   pregabalin (LYRICA) 50 MG capsule, Take 1-3 capsules (50-150 mg total) by mouth at bedtime., Disp: 90 capsule, Rfl: 0  No Known Allergies  I personally reviewed active problem list, medication list, allergies, family history, social history, health maintenance with the patient/caregiver today.   ROS  Ten systems reviewed and is negative except as mentioned in HPI  She still has cognitive dysfunction , some lack of sense of smell, very mild balance problems - stable. She still has stable lower extremity cramps since accident   Objective  Vitals:   06/14/21 1522  BP: 126/68  Pulse: 73  Resp: 16  SpO2: 97%  Weight: 160 lb (72.6 kg)  Height: 5\' 3"  (1.6 m)    Body mass index is 28.34 kg/m.  Physical Exam  Constitutional: Patient appears well-developed and well-nourished. Overweight.  No distress.  HEENT: head atraumatic, normocephalic, pupils equal and reactive to ligh, neck supple Cardiovascular: Normal rate, regular rhythm and normal heart sounds.  No murmur heard. No BLE edema. Pulmonary/Chest: Effort normal and breath sounds normal. No respiratory distress. Abdominal: Soft.  There is neg CVA tenderness. Psychiatric: Patient has a normal mood and  affect. behavior is normal. Judgment and thought content normal.   Recent Results (from the past 2160 hour(s))  Cytology - PAP     Status: None   Collection Time: 04/21/21  1:52 PM  Result Value Ref Range   High risk HPV Negative    Adequacy      Satisfactory for evaluation; transformation zone component ABSENT.   Diagnosis      - Negative for intraepithelial lesion or malignancy (NILM)   Comment Normal Reference Range HPV - Negative   TSH     Status: Abnormal   Collection  Time: 04/22/21 10:02 AM  Result Value Ref Range   TSH 4.720 (H) 0.450 - 4.500 uIU/mL  T3     Status: None   Collection Time: 04/22/21 10:02 AM  Result Value Ref Range   T3, Total 87 71 - 180 ng/dL  POCT Urinalysis Dipstick     Status: Abnormal   Collection Time: 05/12/21  8:37 AM  Result Value Ref Range   Color, UA Yellow    Clarity, UA Cloudy    Glucose, UA Negative Negative   Bilirubin, UA Small    Ketones, UA Negative    Spec Grav, UA 1.020 1.010 - 1.025   Blood, UA Moderate    pH, UA 5.0 5.0 - 8.0   Protein, UA Negative Negative   Urobilinogen, UA 0.2 0.2 or 1.0 E.U./dL   Nitrite, UA Negative    Leukocytes, UA Moderate (2+) (A) Negative   Appearance Yellow    Odor Normal   Urine Culture     Status: None   Collection Time: 05/12/21  9:00 AM   Specimen: Urine  Result Value Ref Range   MICRO NUMBER: [id number]    SPECIMEN QUALITY: Adequate    Sample Source URINE    STATUS: FINAL    ISOLATE 1:      Mixed genital flora isolated. These superficial bacteria are not indicative of a urinary tract infection. No further organism identification is warranted on this specimen. If clinically indicated, recollect clean-catch, mid-stream urine and transfer  immediately to Urine Culture Transport Tube.   POCT Urinalysis Dipstick     Status: None   Collection Time: 06/14/21  3:23 PM  Result Value Ref Range   Color, UA Yellow    Clarity, UA Clear    Glucose, UA Negative Negative   Bilirubin, UA Negative    Ketones, UA Negative    Spec Grav, UA 1.015 1.010 - 1.025   Blood, UA Negative    pH, UA 6.0 5.0 - 8.0   Protein, UA Negative Negative   Urobilinogen, UA 0.2 0.2 or 1.0 E.U./dL   Nitrite, UA Negative    Leukocytes, UA Negative Negative   Appearance     Odor       PHQ2/9: Depression screen [facility] 2/9 06/14/2021 05/20/2021 05/12/2021 04/21/2021 03/10/2021  Decreased Interest 0 0 0 0 0  Down, Depressed, Hopeless 0 0 0 0 0  PHQ - 2 Score 0 0 0 0 0  Altered sleeping 0 0 0 0 0   Tired, decreased energy 0 0 0 0 0  Change in appetite 0 0 0 0 0  Feeling bad or failure about yourself  0 0 0 0 0  Trouble concentrating 0 0 0 0 0  Moving slowly or fidgety/restless 0 0 0 0 0  Suicidal thoughts 0 0 0 0 0  PHQ-9 Score 0 0 0 0 0  Difficult doing work/chores - - Not difficult at all  Not difficult at all Not difficult at all    phq 9 is negative   Fall Risk: Fall Risk  06/14/2021 05/20/2021 05/12/2021 04/21/2021 03/10/2021  Falls in the past year? 0 0 0 0 0  Number falls in past yr: 0 0 0 0 0  Injury with Fall? 0 0 0 0 0  Risk for fall due to : No Fall Risks No Fall Risks No Fall Risks No Fall Risks No Fall Risks  Follow up Falls prevention discussed Falls prevention discussed Falls prevention discussed Falls prevention discussed Falls prevention discussed      Functional Status Survey: Is the patient deaf or have difficulty hearing?: No Does the patient have difficulty seeing, even when wearing glasses/contacts?: No Does the patient have difficulty concentrating, remembering, or making decisions?: No Does the patient have difficulty walking or climbing stairs?: No Does the patient have difficulty dressing or bathing?: No Does the patient have difficulty doing errands alone such as visiting a doctor's office or shopping?: No    Assessment & Plan  1. Dysuria  - POCT Urinalysis Dipstick - CULTURE, URINE COMPREHENSIVE - conjugated estrogens (PREMARIN) vaginal cream; Place 1 Applicatorful vaginally daily.  Dispense: 42.5 g; Refill: 0 - nitrofurantoin, macrocrystal-monohydrate, (MACROBID) 100 MG capsule; Take 1 capsule (100 mg total) by mouth 2 (two) times daily.  Dispense: 10 capsule; Refill: 0

## 2021-06-16 LAB — CULTURE, URINE COMPREHENSIVE
MICRO NUMBER:: 13001204
RESULT:: NO GROWTH
SPECIMEN QUALITY:: ADEQUATE

## 2021-07-13 ENCOUNTER — Telehealth: Payer: Self-pay | Admitting: Family Medicine

## 2021-07-13 NOTE — Telephone Encounter (Signed)
Scheduled for 07-14-2021

## 2021-07-13 NOTE — Telephone Encounter (Signed)
Patient would like to know if she is due for her second shingle shot and would like to schedule a nurse visit, please advise patient directly ?

## 2021-07-14 ENCOUNTER — Ambulatory Visit (INDEPENDENT_AMBULATORY_CARE_PROVIDER_SITE_OTHER): Payer: BC Managed Care – PPO

## 2021-07-14 ENCOUNTER — Other Ambulatory Visit: Payer: Self-pay

## 2021-07-14 DIAGNOSIS — Z23 Encounter for immunization: Secondary | ICD-10-CM | POA: Diagnosis not present

## 2021-07-14 NOTE — Progress Notes (Signed)
Patient tolerated 2nd shingles injection well. Patient stated she had no adverse reaction with 1st shot and is aware of signs and symptoms to report if necessary. ?

## 2021-07-29 ENCOUNTER — Telehealth: Payer: Self-pay

## 2021-07-29 NOTE — Telephone Encounter (Signed)
CALLED PATIENT NO ANSWER LEFT VOICEMAIL FOR A CALL BACK ? ?

## 2021-07-30 ENCOUNTER — Telehealth: Payer: Self-pay

## 2021-07-30 ENCOUNTER — Other Ambulatory Visit: Payer: Self-pay

## 2021-07-30 NOTE — Progress Notes (Signed)
Wants to check with insurance first before scheduling will call us back if she decides to schedule ?

## 2021-07-30 NOTE — Telephone Encounter (Signed)
Wants to check with insurance first before scheduling will call us back if she decides to schedule ?

## 2021-08-09 ENCOUNTER — Encounter: Payer: Self-pay | Admitting: Family Medicine

## 2021-08-10 ENCOUNTER — Other Ambulatory Visit: Payer: Self-pay

## 2021-08-10 DIAGNOSIS — Z1211 Encounter for screening for malignant neoplasm of colon: Secondary | ICD-10-CM

## 2021-09-13 LAB — HM COLONOSCOPY

## 2021-09-30 ENCOUNTER — Telehealth: Payer: Self-pay

## 2021-09-30 DIAGNOSIS — N390 Urinary tract infection, site not specified: Secondary | ICD-10-CM

## 2021-09-30 NOTE — Telephone Encounter (Signed)
Copied from [place] [id number]. Topic: General - Inquiry >> Sep 30, 2021  1:31 PM Loma Boston wrote: Reason for CRM: Pt is wanting Dr Ancil Boozer nurse to FU at [phone]. She states she is having some renal issues and Dr Ancil Boozer had told her a while back that if she started having issues again that she would do a referral for her.

## 2021-09-30 NOTE — Addendum Note (Signed)
Addended by: Teodora Medici on: 09/30/2021 02:24 PM   Modules accepted: Orders

## 2021-09-30 NOTE — Telephone Encounter (Signed)
Pt. Is asking for a urology referral. Has had 2 UTI'S since February.Currently being treated for one from another doctor. Asking to be referred to [facility] on [facility]. Please advise pt.

## 2021-09-30 NOTE — Telephone Encounter (Signed)
Tried to call patient to clarify/get more details about the renal problem she is having. No answer, left vm for callback.

## 2021-10-04 ENCOUNTER — Other Ambulatory Visit: Payer: Self-pay | Admitting: Family Medicine

## 2021-10-04 DIAGNOSIS — R300 Dysuria: Secondary | ICD-10-CM

## 2021-10-04 LAB — HM MAMMOGRAPHY

## 2021-10-04 MED ORDER — PREMARIN 0.625 MG/GM VA CREA: 1.0000 | TOPICAL_CREAM | Freq: Every day | VAGINAL | 0 refills | Status: DC

## 2021-10-12 ENCOUNTER — Ambulatory Visit: Payer: BC Managed Care – PPO | Admitting: Family Medicine

## 2021-10-13 ENCOUNTER — Ambulatory Visit: Payer: BC Managed Care – PPO | Admitting: Urology

## 2022-01-21 ENCOUNTER — Telehealth: Payer: Self-pay | Admitting: Family Medicine

## 2022-01-21 NOTE — Telephone Encounter (Signed)
Copied from [place] [id number]. Topic: General - Inquiry >> Jan 21, 2022 10:05 AM Penni Bombard wrote: Reason for CRM: pt has a question about a precription.  She would like Dr. Ancil Boozer to call her back.  She did not disclose anymore information. CB@  [phone]

## 2022-02-03 ENCOUNTER — Ambulatory Visit: Payer: BC Managed Care – PPO | Admitting: Family Medicine

## 2022-04-21 ENCOUNTER — Other Ambulatory Visit: Payer: Self-pay | Admitting: Family Medicine

## 2022-04-21 DIAGNOSIS — E785 Hyperlipidemia, unspecified: Secondary | ICD-10-CM

## 2022-04-21 DIAGNOSIS — E781 Pure hyperglyceridemia: Secondary | ICD-10-CM

## 2022-05-06 ENCOUNTER — Encounter: Payer: BC Managed Care – PPO | Admitting: Family Medicine

## 2022-05-16 ENCOUNTER — Ambulatory Visit: Payer: BC Managed Care – PPO | Admitting: Physician Assistant

## 2022-05-18 NOTE — Progress Notes (Signed)
Name: Autumn Long   MRN: [id number]    DOB: 1965/05/29   Date:05/19/2022       Progress Note  Subjective  Chief Complaint  Left Side Pain  HPI  Left flank pain: she states symptoms started a few months ago, intermittently, described as a dull ache, sometimes radiates to LLQ and sometimes down her left leg, she denies dysuria, hematuria, fever, chills.She states sleeping on left side is uncomfortable. Walking and stretching seems to help. Episodes are present a few days a week. No pain today No change of appetite, nausea or vomiting   She was seen here 01/19 with similar pain , at the time it was in her left groin but also worse when laying on left lateral decubitus   She was seen by Dr. Kristeen Miss neurosurgeon after a skull fracture and post-concussion in March 2021. She is feeling much better , MRI last year just showed some scarring. She sates still has intermittent dizziness and headaches. Cognition is back to normal. She has been at work but has accommodations. 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, initial encounter ([place]) 10/09/2019   Dyslipidemia 10/18/2014   Keratosis pilaris 10/18/2014   Subclinical hypothyroidism 10/18/2014   Vitamin D deficiency 10/18/2014   History of Hashimoto thyroiditis  03/18/2013    Past Surgical History:  Procedure Laterality Date   [place]   COLONOSCOPY WITH PROPOFOL N/A 04/19/2016   Procedure: COLONOSCOPY WITH PROPOFOL;  Surgeon: Lollie Sails, MD;  Location: [facility] ENDOSCOPY;  Service: Endoscopy;  Laterality: N/A;    Family History  Problem Relation Age of Onset   Heart disease Father    Cancer Maternal Grandmother        Stomach    Social History   Tobacco Use   Smoking status: Never   Smokeless tobacco: Never  Substance Use Topics   Alcohol use: Yes    Alcohol/week: 0.0 standard drinks of alcohol    Comment: occasionally     Current Outpatient Medications:    cholecalciferol (VITAMIN D) 1000 UNITS tablet, Take 2,000 Units by mouth daily., Disp: , Rfl:    cyanocobalamin (VITAMIN B12) 1000 MCG tablet, Take 1,000 mcg by mouth daily., Disp: , Rfl:    omega-3 acid ethyl esters (LOVAZA) 1 g capsule, Take 2 capsules by mouth twice daily, Disp: 120 capsule, Rfl: 0  No Known Allergies  I personally reviewed active problem list, medication list, allergies, family history, social history, health maintenance with the patient/caregiver today.   ROS  Ten systems reviewed and is negative except as mentioned in HPI   Objective  Vitals:   05/19/22 0752  BP: 124/70  Pulse: 87  Resp: 16  SpO2: 100%  Weight: 168 lb (76.2 kg)  Height: '5\' 3"'$  (1.6 m)  Body mass index is 29.76 kg/m.  Physical Exam  Constitutional: Patient appears well-developed and well-nourished. Obese  No distress.  HEENT: head atraumatic, normocephalic, pupils equal and reactive to light, neck supple Cardiovascular: Normal rate, regular rhythm and normal heart sounds.  No murmur heard. No BLE edema. Pulmonary/Chest: Effort normal and breath sounds normal. No respiratory distress. Abdominal: Soft.  There is tenderness during palpation of LLQ, negative CVA tenderness, normal bowel sounds, no masses .  Muscular skeletal: no  pain during palpation of lumbar spine but positive straight leg raise on left side  Psychiatric: Patient has a normal mood and affect. behavior is normal. Judgment and thought content normal.    PHQ2/9:    05/19/2022    7:51 AM 06/14/2021    3:15 PM 05/20/2021    7:30 AM 05/12/2021    8:01 AM 04/21/2021    1:20 PM  Depression screen PHQ 2/9  Decreased Interest 0 0 0 0 0  Down, Depressed, Hopeless 0 0 0 0 0  PHQ - 2 Score 0 0 0 0 0  Altered sleeping 0 0 0 0 0  Tired, decreased energy 0 0 0 0 0  Change in appetite 0 0 0 0 0  Feeling bad or failure about yourself  0 0 0 0 0  Trouble concentrating 0 0 0 0 0  Moving slowly or fidgety/restless 0 0 0 0 0  Suicidal thoughts 0 0 0 0 0  PHQ-9 Score 0 0 0 0 0  Difficult doing work/chores    Not difficult at all Not difficult at all    phq 9 is negative   Fall Risk:    05/19/2022    7:50 AM 06/14/2021    3:15 PM 05/20/2021    7:30 AM 05/12/2021    8:01 AM 04/21/2021    1:20 PM  Fall Risk   Falls in the past year? 0 0 0 0 0  Number falls in past yr: 0 0 0 0 0  Injury with Fall? 0 0 0 0 0  Risk for fall due to : No Fall Risks No Fall Risks No Fall Risks No Fall Risks No Fall Risks  Follow up Falls prevention discussed Falls prevention discussed Falls prevention discussed Falls prevention discussed Falls prevention discussed      Functional Status Survey: Is the patient deaf or have difficulty hearing?: No Does the patient have difficulty seeing, even when wearing glasses/contacts?: No Does the patient have difficulty concentrating, remembering, or making decisions?: No Does the patient have difficulty walking or climbing stairs?: No Does the patient have difficulty dressing or bathing?: No Does the patient have difficulty doing errands alone such as visiting a doctor's office or shopping?: No    Assessment & Plan  1. Dyslipidemia  - omega-3 acid ethyl esters (LOVAZA) 1 g capsule; Take 2 capsules (2 g total) by mouth 2 (two)  times daily.  Dispense: 360 capsule; Refill: 3 - Lipid panel  2. Left flank pain, chronic  - Urinalysis, Complete - CBC with Differential/Platelet  CT ordered   3. Subclinical hypothyroidism  - TSH  4. Paresthesia of left leg  Stable, she also has chronic paresthesia on both hands and feet since head trauma  5. Pre-diabetes  - Hemoglobin A1c  6. Long-term use of high-risk medication  - Comprehensive metabolic panel  7. Vitamin D deficiency  - VITAMIN D 25 Hydroxy (Vit-D Deficiency, Fractures)  8. B12 deficiency  - B12 and Folate Panel

## 2022-05-19 ENCOUNTER — Ambulatory Visit: Payer: BC Managed Care – PPO | Admitting: Family Medicine

## 2022-05-19 ENCOUNTER — Encounter: Payer: Self-pay | Admitting: Family Medicine

## 2022-05-19 VITALS — BP 124/70 | HR 87 | Resp 16 | Ht 63.0 in | Wt 168.0 lb

## 2022-05-19 DIAGNOSIS — G8929 Other chronic pain: Secondary | ICD-10-CM

## 2022-05-19 DIAGNOSIS — E559 Vitamin D deficiency, unspecified: Secondary | ICD-10-CM

## 2022-05-19 DIAGNOSIS — Z79899 Other long term (current) drug therapy: Secondary | ICD-10-CM

## 2022-05-19 DIAGNOSIS — E038 Other specified hypothyroidism: Secondary | ICD-10-CM

## 2022-05-19 DIAGNOSIS — E785 Hyperlipidemia, unspecified: Secondary | ICD-10-CM

## 2022-05-19 DIAGNOSIS — R202 Paresthesia of skin: Secondary | ICD-10-CM

## 2022-05-19 DIAGNOSIS — R109 Unspecified abdominal pain: Secondary | ICD-10-CM | POA: Diagnosis not present

## 2022-05-19 DIAGNOSIS — E538 Deficiency of other specified B group vitamins: Secondary | ICD-10-CM

## 2022-05-19 DIAGNOSIS — R7303 Prediabetes: Secondary | ICD-10-CM

## 2022-05-19 MED ORDER — OMEGA-3-ACID ETHYL ESTERS 1 G PO CAPS: 2.0000 | ORAL_CAPSULE | Freq: Two times a day (BID) | ORAL | 3 refills | Status: DC

## 2022-05-19 NOTE — Addendum Note (Signed)
Addended by: Carlene Coria on: 05/19/2022 08:31 AM   Modules accepted: Orders

## 2022-05-19 NOTE — Telephone Encounter (Unsigned)
Copied from [place] [id number]. Topic: Referral - Request for Referral >> May 19, 2022 12:16 PM Everette C wrote: Has patient seen PCP for this complaint? Yes.   *If NO, is insurance requiring patient see PCP for this issue before PCP can refer them? Referral for which specialty: Imaging  Preferred provider/office: [place]  Reason for referral: patient would like to have a CT scan with contrast of the pelvis >> May 19, 2022 12:19 PM Everette C wrote: The patient would like to speak with a member of clinical staff regarding their referral as well

## 2022-05-30 ENCOUNTER — Encounter: Payer: BC Managed Care – PPO | Admitting: Family Medicine

## 2022-05-31 ENCOUNTER — Encounter: Payer: Self-pay | Admitting: Family Medicine

## 2022-05-31 LAB — COMPREHENSIVE METABOLIC PANEL
ALT: 27 IU/L (ref 0–32)
AST: 22 IU/L (ref 0–40)
Albumin/Globulin Ratio: 1.4 (ref 1.2–2.2)
Albumin: 4.4 g/dL (ref 3.8–4.9)
Alkaline Phosphatase: 83 IU/L (ref 44–121)
BUN/Creatinine Ratio: 21 (ref 9–23)
BUN: 14 mg/dL (ref 6–24)
Bilirubin Total: 0.4 mg/dL (ref 0.0–1.2)
CO2: 22 mmol/L (ref 20–29)
Calcium: 9.1 mg/dL (ref 8.7–10.2)
Chloride: 104 mmol/L (ref 96–106)
Creatinine, Ser: 0.66 mg/dL (ref 0.57–1.00)
Globulin, Total: 3.1 g/dL (ref 1.5–4.5)
Glucose: 97 mg/dL (ref 70–99)
Potassium: 4.7 mmol/L (ref 3.5–5.2)
Sodium: 139 mmol/L (ref 134–144)
Total Protein: 7.5 g/dL (ref 6.0–8.5)
eGFR: 102 mL/min/{1.73_m2} (ref >59)

## 2022-05-31 LAB — CBC WITH DIFFERENTIAL/PLATELET
Basophils Absolute: 0 10*3/uL (ref 0.0–0.2)
Basos: 0 %
EOS (ABSOLUTE): 0.1 10*3/uL (ref 0.0–0.4)
Eos: 1 %
Hematocrit: 40.9 % (ref 34.0–46.6)
Hemoglobin: 13.8 g/dL (ref 11.1–15.9)
Immature Grans (Abs): 0 10*3/uL (ref 0.0–0.1)
Immature Granulocytes: 0 %
Lymphocytes Absolute: 1.9 10*3/uL (ref 0.7–3.1)
Lymphs: 36 %
MCH: 28.5 pg (ref 26.6–33.0)
MCHC: 33.7 g/dL (ref 31.5–35.7)
MCV: 85 fL (ref 79–97)
Monocytes Absolute: 0.3 10*3/uL (ref 0.1–0.9)
Monocytes: 6 %
Neutrophils Absolute: 3 10*3/uL (ref 1.4–7.0)
Neutrophils: 57 %
Platelets: 190 10*3/uL (ref 150–450)
RBC: 4.84 x10E6/uL (ref 3.77–5.28)
RDW: 13.1 % (ref 11.7–15.4)
WBC: 5.3 10*3/uL (ref 3.4–10.8)

## 2022-05-31 LAB — URINALYSIS, COMPLETE
Bilirubin, UA: NEGATIVE
Glucose, UA: NEGATIVE
Ketones, UA: NEGATIVE
Nitrite, UA: NEGATIVE
Protein,UA: NEGATIVE
RBC, UA: NEGATIVE
Specific Gravity, UA: 1.02 (ref 1.005–1.030)
Urobilinogen, Ur: 0.2 mg/dL (ref 0.2–1.0)
pH, UA: 5.5 (ref 5.0–7.5)

## 2022-05-31 LAB — MICROSCOPIC EXAMINATION
Casts: NONE SEEN /lpf
Epithelial Cells (non renal): >10 /hpf — AB (ref 0–10)
RBC, Urine: NONE SEEN /hpf (ref 0–2)

## 2022-05-31 LAB — B12 AND FOLATE PANEL
Folate: 17.7 ng/mL (ref >3.0)
Vitamin B-12: 1358 pg/mL — ABNORMAL HIGH (ref 232–1245)

## 2022-05-31 LAB — LIPID PANEL
Chol/HDL Ratio: 5.1 ratio — ABNORMAL HIGH (ref 0.0–4.4)
Cholesterol, Total: 220 mg/dL — ABNORMAL HIGH (ref 100–199)
HDL: 43 mg/dL (ref >39)
LDL Chol Calc (NIH): 154 mg/dL — ABNORMAL HIGH (ref 0–99)
Triglycerides: 127 mg/dL (ref 0–149)
VLDL Cholesterol Cal: 23 mg/dL (ref 5–40)

## 2022-05-31 LAB — VITAMIN D 25 HYDROXY (VIT D DEFICIENCY, FRACTURES): Vit D, 25-Hydroxy: 23.8 ng/mL — ABNORMAL LOW (ref 30.0–100.0)

## 2022-05-31 LAB — HEMOGLOBIN A1C
Est. average glucose Bld gHb Est-mCnc: 126 mg/dL
Hgb A1c MFr Bld: 6 % — ABNORMAL HIGH (ref 4.8–5.6)

## 2022-05-31 LAB — TSH: TSH: 5.51 u[IU]/mL — ABNORMAL HIGH (ref 0.450–4.500)

## 2022-06-01 ENCOUNTER — Encounter: Payer: BC Managed Care – PPO | Admitting: Family Medicine

## 2022-06-02 ENCOUNTER — Encounter: Payer: BC Managed Care – PPO | Admitting: Family Medicine

## 2022-07-04 ENCOUNTER — Encounter: Payer: Self-pay | Admitting: Family Medicine

## 2022-07-25 NOTE — Progress Notes (Signed)
Name: Autumn Long   MRN: [id number]    DOB: 05/17/1965   Date:07/26/2022       Progress Note  Subjective  Chief Complaint  Annual Exam  HPI  Patient presents for annual CPE.  Diet: she has been cutting down on carbohydrates, eating more fruit and vegetables and whole grains Exercise: she has been walking 4 times a week for 20 minutes to 30 minutes , she will start pickle ball soon  Last Eye Exam: up to date  Last Dental Exam: up to date   [place] Visit from 07/26/2022 in [facility]  AUDIT-C Score 2      Depression: Phq 9 is  negative    07/26/2022    2:54 PM 05/19/2022    7:51 AM 06/14/2021    3:15 PM 05/20/2021    7:30 AM 05/12/2021    8:01 AM  Depression screen PHQ 2/9  Decreased Interest 0 0 0 0 0  Down, Depressed, Hopeless 0 0 0 0 0  PHQ - 2 Score 0 0 0 0 0  Altered sleeping 0 0 0 0 0  Tired, decreased energy 0 0 0 0 0  Change in appetite 0 0 0 0 0  Feeling bad or failure about yourself  0 0 0 0 0  Trouble concentrating 0 0 0 0 0  Moving slowly or fidgety/restless 0 0 0 0 0  Suicidal thoughts 0 0 0 0 0  PHQ-9 Score 0 0 0 0 0  Difficult doing work/chores     Not difficult at all   Hypertension: BP Readings from Last 3 Encounters:  07/26/22 116/72  05/19/22 124/70  06/14/21 126/68   Obesity: Wt Readings from Last 3 Encounters:  07/26/22 168 lb 8 oz (76.4 kg)  05/19/22 168 lb (76.2 kg)  06/14/21 160 lb (72.6 kg)   BMI Readings from Last 3 Encounters:  07/26/22 29.85 kg/m  05/19/22 29.76 kg/m  06/14/21 28.34 kg/m     Vaccines:    Tdap: up to date Shingrix: up to date Pneumonia: N/A Flu: up to date COVID-71: up to date   Hep C Screening: 11/12/19 STD testing and prevention (HIV/chl/gon/syphilis): 07/26/19 Intimate partner violence: negative screen  Sexual History :one partner no problems  Menstrual History/LMP/Abnormal Bleeding: post-menopausal  Discussed importance of follow up if any  post-menopausal bleeding: yes  Incontinence Symptoms: negative for symptoms   Breast cancer:  - Last Mammogram: 10/04/21 - BRCA gene screening: N/A  Osteoporosis Prevention : Discussed high calcium and vitamin D supplementation, weight bearing exercises Bone density: N/A  Cervical cancer screening: 04/21/21  Skin cancer: Discussed monitoring for atypical lesions  Colorectal cancer: 09/13/21   Lung cancer:  Low Dose CT Chest recommended if Age 70-80 years, 20 pack-year currently smoking OR have quit w/in 15years. Patient does not qualify for screen   ECG: 07/25/19  Advanced Care Planning: A voluntary discussion about advance care planning including the explanation and discussion of advance directives.  Discussed health care proxy and Living will, and the patient was able to identify a health care proxy as husband .  Patient does not have a living will and power of attorney of health care   Lipids: Lab Results  Component Value Date   CHOL 220 (H) 05/30/2022   CHOL 223 (H) 10/01/2020   CHOL 235 (H) 11/12/2019   Lab Results  Component Value Date   HDL 43 05/30/2022   HDL 52 10/01/2020   HDL 53 11/12/2019  Lab Results  Component Value Date   LDLCALC 154 (H) 05/30/2022   LDLCALC 150 (H) 10/01/2020   LDLCALC 166 (H) 11/12/2019   Lab Results  Component Value Date   TRIG 127 05/30/2022   TRIG 120 10/01/2020   TRIG 92 11/12/2019   Lab Results  Component Value Date   CHOLHDL 5.1 (H) 05/30/2022   CHOLHDL 4.3 10/01/2020   CHOLHDL 4.4 11/12/2019   No results found for: "LDLDIRECT"  Glucose: Glucose  Date Value Ref Range Status  05/30/2022 97 70 - 99 mg/dL Final  10/01/2020 96 65 - 99 mg/dL Final  11/12/2019 94 65 - 99 mg/dL Final   Glucose, Bld  Date Value Ref Range Status  07/25/2019 139 (H) 70 - 99 mg/dL Final    Comment:    Glucose reference range applies only to samples taken after fasting for at least 8 hours.    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, initial encounter ([place]) 10/09/2019   Dyslipidemia 10/18/2014   Keratosis pilaris 10/18/2014   Subclinical hypothyroidism 10/18/2014   Vitamin D deficiency 10/18/2014   History of Hashimoto thyroiditis 03/18/2013    Past Surgical History:  Procedure Laterality Date   [place]   COLONOSCOPY WITH PROPOFOL N/A 04/19/2016   Procedure: COLONOSCOPY WITH PROPOFOL;  Surgeon: Lollie Sails, MD;  Location: [facility] ENDOSCOPY;  Service: Endoscopy;  Laterality: N/A;    Family History  Problem Relation Age of Onset   Heart disease Father    Cancer Maternal Grandmother        Stomach    Social History   Socioeconomic History   Marital status: Married    Spouse name: Madalie Avans   Number of children: 2   Years of education: 52   Highest education level: Bachelor's degree (e.g., BA, AB, BS)  Occupational History   Not on file  Tobacco Use   Smoking status: Never   Smokeless tobacco: Never  Vaping Use   Vaping Use: Never used  Substance and Sexual Activity   Alcohol use: Yes    Alcohol/week: 0.0 standard drinks of alcohol    Comment: occasionally   Drug use: No   Sexual activity: Yes    Partners: Male  Other Topics Concern   Not on file  Social History Narrative   Teaches Math   Social Determinants of Health   Financial Resource Strain: Low Risk  (07/26/2022)   Overall Financial Resource Strain (CARDIA)    Difficulty of Paying Living Expenses: Not hard at all  Food Insecurity: No Food Insecurity (07/26/2022)    Hunger Vital Sign    Worried About Running Out of Food in the Last Year: Never true    Ran Out of Food in the Last Year: Never true  Transportation Needs: No Transportation Needs (07/26/2022)   PRAPARE - Hydrologist (Medical): No    Lack of Transportation (Non-Medical): No  Physical Activity: Insufficiently Active (07/26/2022)   Exercise Vital Sign    Days of Exercise per Week: 4 days    Minutes  of Exercise per Session: 20 min  Stress: No Stress Concern Present (07/26/2022)   [place]    Feeling of Stress : Only a little  Social Connections: Moderately Integrated (07/26/2022)   Social Connection and Isolation Panel [NHANES]    Frequency of Communication with Friends and Family: More than three times a week    Frequency of Social Gatherings with Friends and Family: Once a week    Attends Religious Services: Never    Marine scientist or Organizations: Yes    Attends Music therapist: More than 4 times per year    Marital Status: Married  Human resources officer Violence: Not At Risk (07/26/2022)   Humiliation, Afraid, Rape, and Kick questionnaire    Fear of Current or Ex-Partner: No    Emotionally Abused: No    Physically Abused: No    Sexually Abused: No     Current Outpatient Medications:    cholecalciferol (VITAMIN D) 1000 UNITS tablet, Take 2,000 Units by mouth daily., Disp: , Rfl:    cyanocobalamin (VITAMIN B12) 1000 MCG tablet, Take 1,000 mcg by mouth daily., Disp: , Rfl:    omega-3 acid ethyl esters (LOVAZA) 1 g capsule, Take 2 capsules (2 g total) by mouth 2 (two) times daily., Disp: 360 capsule, Rfl: 3  No Known Allergies   ROS  Constitutional: Negative for fever or weight change.  Respiratory: Negative for cough and shortness of breath.   Cardiovascular: Negative for chest pain or palpitations.  Gastrointestinal: Negative for abdominal pain, no bowel changes.   Musculoskeletal: Negative for gait problem or joint swelling. Some symptoms of plantar fascitis on right foot, discussed stretching and rolling on ice  Skin: Negative for rash.  Neurological: Negative for dizziness or headache.  No other specific complaints in a complete review of systems (except as listed in HPI above).   Objective  Vitals:   07/26/22 1456  BP: 116/72  Pulse: 68  Resp: 16  Temp: 97.8 F (36.6 C)  TempSrc: Oral  SpO2: 100%  Weight: 168 lb 8 oz (76.4 kg)  Height: 5\' 3"  (1.6 m)    Body mass index is 29.85 kg/m.  Physical Exam  Constitutional: Patient appears well-developed and well-nourished. No distress.  HENT: Head: Normocephalic and atraumatic. Ears: B TMs ok, no erythema or effusion; Nose: Nose normal. Mouth/Throat: Oropharynx is clear and moist. No oropharyngeal exudate.  Eyes: Conjunctivae and EOM are normal. Pupils are equal, round, and reactive to light. No scleral icterus.  Neck: Normal range of motion. Neck supple. No JVD present. No thyromegaly present.  Cardiovascular: Normal rate, regular rhythm and normal heart sounds.  No murmur heard. No BLE edema. Pulmonary/Chest: Effort normal and breath sounds normal. No respiratory distress. Abdominal: Soft. Bowel sounds are normal, no distension. There is no tenderness. no masses Breast: no lumps or masses, no nipple discharge or rashes FEMALE GENITALIA:  Not done  RECTAL: not done  Musculoskeletal: Normal range of motion, no joint effusions. No gross deformities Neurological: he is alert and oriented to person, place, and time. No cranial nerve deficit. Coordination, balance, strength, speech and gait are normal.  Skin: Skin is warm and dry. No rash noted. No erythema.  Psychiatric: Patient has a normal mood and affect. behavior is normal. Judgment and thought content normal.   Recent Results (from the past 2160 hour(s))  B12 and Folate Panel     Status: Abnormal   Collection Time: 05/30/22  7:10 AM  Result Value Ref Range   Vitamin B-12 1,358 (H) 232 - 1,245 pg/mL   Folate 17.7 >3.0 ng/mL    Comment: A serum folate concentration of less than 3.1 ng/mL is considered to represent clinical deficiency.   CBC with Differential/Platelet     Status: None   Collection Time: 05/30/22  7:10 AM  Result Value Ref Range   WBC 5.3 3.4 - 10.8 x10E3/uL   RBC 4.84 3.77 - 5.28 x10E6/uL   Hemoglobin 13.8 11.1 - 15.9 g/dL   Hematocrit 40.9 34.0 - 46.6 %   MCV 85 79 - 97 fL   MCH 28.5 26.6 - 33.0 pg   MCHC 33.7 31.5 - 35.7 g/dL   RDW 13.1 11.7 - 15.4 %   Platelets 190 150 - 450 x10E3/uL   Neutrophils 57 Not Estab. %   Lymphs 36 Not Estab. %   Monocytes 6 Not Estab. %   Eos 1 Not Estab. %   Basos 0 Not Estab. %   Neutrophils Absolute 3.0 1.4 - 7.0 x10E3/uL   Lymphocytes Absolute 1.9 0.7 - 3.1 x10E3/uL   Monocytes Absolute 0.3 0.1 - 0.9 x10E3/uL   EOS (ABSOLUTE) 0.1 0.0 - 0.4 x10E3/uL   Basophils Absolute 0.0 0.0 - 0.2 x10E3/uL   Immature Granulocytes 0 Not Estab. %   Immature Grans (Abs) 0.0 0.0 - 0.1 x10E3/uL  Comprehensive metabolic panel     Status: None   Collection Time: 05/30/22  7:10 AM  Result Value Ref Range   Glucose 97 70 - 99 mg/dL   BUN 14 6 - 24 mg/dL   Creatinine, Ser 0.66 0.57 - 1.00 mg/dL   eGFR 102 >59 mL/min/1.73   BUN/Creatinine Ratio 21 9 - 23   Sodium 139 134 - 144 mmol/L   Potassium 4.7 3.5 - 5.2 mmol/L   Chloride 104 96 - 106 mmol/L   CO2 22 20 - 29 mmol/L   Calcium 9.1 8.7 - 10.2 mg/dL   Total Protein 7.5 6.0 - 8.5 g/dL   Albumin 4.4 3.8 - 4.9 g/dL   Globulin, Total 3.1 1.5 - 4.5 g/dL   Albumin/Globulin Ratio 1.4 1.2 - 2.2   Bilirubin Total 0.4 0.0 - 1.2 mg/dL   Alkaline Phosphatase 83 44 - 121 IU/L   AST 22 0 - 40 IU/L   ALT 27 0 - 32 IU/L  Hemoglobin A1c     Status: Abnormal   Collection Time: 05/30/22  7:10 AM  Result Value Ref Range   Hgb A1c MFr Bld 6.0 (H) 4.8 - 5.6 %    Comment:          Prediabetes: 5.7 - 6.4          Diabetes: >6.4           Glycemic control for adults with diabetes: <7.0    Est. average glucose Bld gHb Est-mCnc 126 mg/dL  Lipid panel     Status: Abnormal   Collection Time: 05/30/22  7:10 AM  Result Value Ref Range   Cholesterol, Total 220 (H) 100 - 199 mg/dL   Triglycerides 127 0 - 149 mg/dL   HDL 43 >39 mg/dL   VLDL Cholesterol Cal 23 5 - 40 mg/dL   LDL Chol Calc (NIH) 154 (H) 0 - 99 mg/dL   Chol/HDL Ratio 5.1 (H) 0.0 - 4.4 ratio    Comment:  T. Chol/HDL Ratio                                             Men  Women                               1/2 Avg.Risk  3.4    3.3                                   Avg.Risk  5.0    4.4                                2X Avg.Risk  9.6    7.1                                3X Avg.Risk 23.4   11.0   TSH     Status: Abnormal   Collection Time: 05/30/22  7:10 AM  Result Value Ref Range   TSH 5.510 (H) 0.450 - 4.500 uIU/mL  Urinalysis, Complete     Status: Abnormal   Collection Time: 05/30/22  7:10 AM  Result Value Ref Range   Specific Gravity, UA 1.020 1.005 - 1.030   pH, UA 5.5 5.0 - 7.5   Color, UA Yellow Yellow   Appearance Ur Clear Clear   Leukocytes,UA 2+ (A) Negative   Protein,UA Negative Negative/Trace   Glucose, UA Negative Negative   Ketones, UA Negative Negative   RBC, UA Negative Negative   Bilirubin, UA Negative Negative   Urobilinogen, Ur 0.2 0.2 - 1.0 mg/dL   Nitrite, UA Negative Negative   Microscopic Examination See below:     Comment: Microscopic was indicated and was performed.  VITAMIN D 25 Hydroxy (Vit-D Deficiency, Fractures)     Status: Abnormal   Collection Time: 05/30/22  7:10 AM  Result Value Ref Range   Vit D, 25-Hydroxy 23.8 (L) 30.0 - 100.0 ng/mL    Comment: Vitamin D deficiency has been defined by the [place] practice guideline as a level of serum 25-OH vitamin D less than 20 ng/mL (1,2). The Endocrine Society went on to further define vitamin  D insufficiency as a level between 21 and 29 ng/mL (2). 1. IOM (Institute of Medicine). 2010. Dietary reference    intakes for calcium and D. [place]: The    [employer]. 2. Holick MF, Binkley [place], Bischoff-Ferrari HA, et al.    Evaluation, treatment, and prevention of vitamin D    deficiency: an Endocrine Society clinical practice    guideline. JCEM. 2011 Jul; 96(7):1911-30.   Microscopic Examination     Status: Abnormal   Collection Time: 05/30/22  7:10 AM  Result Value Ref Range   WBC, UA 11-30 (A) 0 - 5 /hpf   RBC, Urine None seen 0 - 2 /hpf   Epithelial Cells (non renal) >10 (A) 0 - 10 /hpf   Casts None seen None seen /lpf   Bacteria, UA Few None seen/Few     Fall Risk:    07/26/2022    2:54 PM 05/19/2022    7:50 AM 06/14/2021  3:15 PM 05/20/2021    7:30 AM 05/12/2021    8:01 AM  Fall Risk   Falls in the past year? 0 0 0 0 0  Number falls in past yr:  0 0 0 0  Injury with Fall?  0 0 0 0  Risk for fall due to : No Fall Risks No Fall Risks No Fall Risks No Fall Risks No Fall Risks  Follow up Falls prevention discussed;Education provided;Falls evaluation completed Falls prevention discussed Falls prevention discussed Falls prevention discussed Falls prevention discussed     Functional Status Survey: Is the patient deaf or have difficulty hearing?: No Does the patient have difficulty seeing, even when wearing glasses/contacts?: No Does the patient have difficulty concentrating, remembering, or making decisions?: No Does the patient have difficulty walking or climbing stairs?: No Does the patient have difficulty dressing or bathing?: No Does the patient have difficulty doing errands alone such as visiting a doctor's office or shopping?: No   Assessment & Plan  1. Well adult exam   2. Encounter for screening mammogram for breast cancer  Scheduled   3. Atherosclerosis of abdominal aorta ([place])  Reviewed last CT done, back pain likely from  her  back   -USPSTF grade A and B recommendations reviewed with patient; age-appropriate recommendations, preventive care, screening tests, etc discussed and encouraged; healthy living encouraged; see AVS for patient education given to patient -Discussed importance of 150 minutes of physical activity weekly, eat two servings of fish weekly, eat one serving of tree nuts ( cashews, pistachios, pecans, almonds.Marland Kitchen) every other day, eat 6 servings of fruit/vegetables daily and drink plenty of water and avoid sweet beverages.   -Reviewed Health Maintenance: Yes.

## 2022-07-25 NOTE — Patient Instructions (Signed)
Preventive Care 40-58 Years Old, Female Preventive care refers to lifestyle choices and visits with your health care provider that can promote health and wellness. Preventive care visits are also called wellness exams. What can I expect for my preventive care visit? Counseling Your health care provider may ask you questions about your: Medical history, including: Past medical problems. Family medical history. Pregnancy history. Current health, including: Menstrual cycle. Method of birth control. Emotional well-being. Home life and relationship well-being. Sexual activity and sexual health. Lifestyle, including: Alcohol, nicotine or tobacco, and drug use. Access to firearms. Diet, exercise, and sleep habits. Work and work environment. Sunscreen use. Safety issues such as seatbelt and bike helmet use. Physical exam Your health care provider will check your: Height and weight. These may be used to calculate your BMI (body mass index). BMI is a measurement that tells if you are at a healthy weight. Waist circumference. This measures the distance around your waistline. This measurement also tells if you are at a healthy weight and may help predict your risk of certain diseases, such as type 2 diabetes and high blood pressure. Heart rate and blood pressure. Body temperature. Skin for abnormal spots. What immunizations do I need?  Vaccines are usually given at various ages, according to a schedule. Your health care provider will recommend vaccines for you based on your age, medical history, and lifestyle or other factors, such as travel or where you work. What tests do I need? Screening Your health care provider may recommend screening tests for certain conditions. This may include: Lipid and cholesterol levels. Diabetes screening. This is done by checking your blood sugar (glucose) after you have not eaten for a while (fasting). Pelvic exam and Pap test. Hepatitis B test. Hepatitis C  test. HIV (human immunodeficiency virus) test. STI (sexually transmitted infection) testing, if you are at risk. Lung cancer screening. Colorectal cancer screening. Mammogram. Talk with your health care provider about when you should start having regular mammograms. This may depend on whether you have a family history of breast cancer. BRCA-related cancer screening. This may be done if you have a family history of breast, ovarian, tubal, or peritoneal cancers. Bone density scan. This is done to screen for osteoporosis. Talk with your health care provider about your test results, treatment options, and if necessary, the need for more tests. Follow these instructions at home: Eating and drinking  Eat a diet that includes fresh fruits and vegetables, whole grains, lean protein, and low-fat dairy products. Take vitamin and mineral supplements as recommended by your health care provider. Do not drink alcohol if: Your health care provider tells you not to drink. You are pregnant, may be pregnant, or are planning to become pregnant. If you drink alcohol: Limit how much you have to 0-1 drink a day. Know how much alcohol is in your drink. In the [place] one drink equals one 12 oz bottle of beer (355 mL), one 5 oz glass of wine (148 mL), or one 1 oz glass of hard liquor (44 mL). Lifestyle Brush your teeth every morning and night with fluoride toothpaste. Floss one time each day. Exercise for at least 30 minutes 5 or more days each week. Do not use any products that contain nicotine or tobacco. These products include cigarettes, chewing tobacco, and vaping devices, such as e-cigarettes. If you need help quitting, ask your health care provider. Do not use drugs. If you are sexually active, practice safe sex. Use a condom or other form of protection to   prevent STIs. If you do not wish to become pregnant, use a form of birth control. If you plan to become pregnant, see your health care provider for a  prepregnancy visit. Take aspirin only as told by your health care provider. Make sure that you understand how much to take and what form to take. Work with your health care provider to find out whether it is safe and beneficial for you to take aspirin daily. Find healthy ways to manage stress, such as: Meditation, yoga, or listening to music. Journaling. Talking to a trusted person. Spending time with friends and family. Minimize exposure to UV radiation to reduce your risk of skin cancer. Safety Always wear your seat belt while driving or riding in a vehicle. Do not drive: If you have been drinking alcohol. Do not ride with someone who has been drinking. When you are tired or distracted. While texting. If you have been using any mind-altering substances or drugs. Wear a helmet and other protective equipment during sports activities. If you have firearms in your house, make sure you follow all gun safety procedures. Seek help if you have been physically or sexually abused. What's next? Visit your health care provider once a year for an annual wellness visit. Ask your health care provider how often you should have your eyes and teeth checked. Stay up to date on all vaccines. This information is not intended to replace advice given to you by your health care provider. Make sure you discuss any questions you have with your health care provider. Document Revised: 10/14/2020 Document Reviewed: 10/14/2020 Elsevier Patient Education  2023 Elsevier Inc.  

## 2022-07-26 ENCOUNTER — Encounter: Payer: Self-pay | Admitting: Family Medicine

## 2022-07-26 ENCOUNTER — Ambulatory Visit (INDEPENDENT_AMBULATORY_CARE_PROVIDER_SITE_OTHER): Payer: BC Managed Care – PPO | Admitting: Family Medicine

## 2022-07-26 VITALS — BP 116/72 | HR 68 | Temp 97.8°F | Resp 16 | Ht 63.0 in | Wt 168.5 lb

## 2022-07-26 DIAGNOSIS — I700 Atherosclerosis of aorta: Secondary | ICD-10-CM | POA: Diagnosis not present

## 2022-07-26 DIAGNOSIS — Z0000 Encounter for general adult medical examination without abnormal findings: Secondary | ICD-10-CM | POA: Diagnosis not present

## 2022-07-26 DIAGNOSIS — Z1231 Encounter for screening mammogram for malignant neoplasm of breast: Secondary | ICD-10-CM

## 2022-10-06 LAB — HM MAMMOGRAPHY

## 2023-01-04 ENCOUNTER — Other Ambulatory Visit: Payer: Self-pay | Admitting: Family Medicine

## 2023-01-04 ENCOUNTER — Telehealth: Payer: Self-pay | Admitting: Family Medicine

## 2023-01-04 DIAGNOSIS — R7303 Prediabetes: Secondary | ICD-10-CM

## 2023-01-04 DIAGNOSIS — E538 Deficiency of other specified B group vitamins: Secondary | ICD-10-CM

## 2023-01-04 DIAGNOSIS — E559 Vitamin D deficiency, unspecified: Secondary | ICD-10-CM

## 2023-01-04 DIAGNOSIS — E781 Pure hyperglyceridemia: Secondary | ICD-10-CM

## 2023-01-04 DIAGNOSIS — Z79899 Other long term (current) drug therapy: Secondary | ICD-10-CM

## 2023-01-04 DIAGNOSIS — E785 Hyperlipidemia, unspecified: Secondary | ICD-10-CM

## 2023-01-04 DIAGNOSIS — Z0000 Encounter for general adult medical examination without abnormal findings: Secondary | ICD-10-CM

## 2023-01-04 DIAGNOSIS — Z8639 Personal history of other endocrine, nutritional and metabolic disease: Secondary | ICD-10-CM

## 2023-01-04 NOTE — Telephone Encounter (Signed)
Copied from CRM [id number]. Topic: General - Inquiry >> Jan 04, 2023  3:13 PM Lennox Pippins wrote: Patient was questioing if she needs labs before her appt on October 8th. Patient wanted to come in prior if this needs to be done. If so, patient wants to be called when orders are put in.   Patient callback # [phone]

## 2023-01-17 ENCOUNTER — Encounter: Payer: Self-pay | Admitting: Family Medicine

## 2023-01-19 ENCOUNTER — Other Ambulatory Visit: Payer: Self-pay

## 2023-01-19 DIAGNOSIS — R7303 Prediabetes: Secondary | ICD-10-CM

## 2023-01-19 DIAGNOSIS — E559 Vitamin D deficiency, unspecified: Secondary | ICD-10-CM

## 2023-01-19 DIAGNOSIS — Z79899 Other long term (current) drug therapy: Secondary | ICD-10-CM

## 2023-01-19 DIAGNOSIS — E781 Pure hyperglyceridemia: Secondary | ICD-10-CM

## 2023-01-19 DIAGNOSIS — Z0000 Encounter for general adult medical examination without abnormal findings: Secondary | ICD-10-CM

## 2023-01-19 DIAGNOSIS — Z8639 Personal history of other endocrine, nutritional and metabolic disease: Secondary | ICD-10-CM

## 2023-01-19 DIAGNOSIS — E538 Deficiency of other specified B group vitamins: Secondary | ICD-10-CM

## 2023-01-19 DIAGNOSIS — E785 Hyperlipidemia, unspecified: Secondary | ICD-10-CM

## 2023-01-28 LAB — COMPREHENSIVE METABOLIC PANEL
ALT: 33 [IU]/L — ABNORMAL HIGH (ref 0–32)
AST: 21 [IU]/L (ref 0–40)
Albumin: 4.5 g/dL (ref 3.8–4.9)
Alkaline Phosphatase: 78 [IU]/L (ref 44–121)
BUN/Creatinine Ratio: 22 (ref 9–23)
BUN: 16 mg/dL (ref 6–24)
Bilirubin Total: 0.5 mg/dL (ref 0.0–1.2)
CO2: 21 mmol/L (ref 20–29)
Calcium: 9.4 mg/dL (ref 8.7–10.2)
Chloride: 101 mmol/L (ref 96–106)
Creatinine, Ser: 0.72 mg/dL (ref 0.57–1.00)
Globulin, Total: 2.8 g/dL (ref 1.5–4.5)
Glucose: 102 mg/dL — ABNORMAL HIGH (ref 70–99)
Potassium: 4.6 mmol/L (ref 3.5–5.2)
Sodium: 138 mmol/L (ref 134–144)
Total Protein: 7.3 g/dL (ref 6.0–8.5)
eGFR: 97 mL/min/{1.73_m2} (ref >59)

## 2023-01-28 LAB — CBC WITH DIFFERENTIAL/PLATELET
Basophils Absolute: 0 10*3/uL (ref 0.0–0.2)
Basos: 1 %
EOS (ABSOLUTE): 0.1 10*3/uL (ref 0.0–0.4)
Eos: 1 %
Hematocrit: 43.3 % (ref 34.0–46.6)
Hemoglobin: 13.6 g/dL (ref 11.1–15.9)
Immature Grans (Abs): 0 10*3/uL (ref 0.0–0.1)
Immature Granulocytes: 0 %
Lymphocytes Absolute: 2.1 10*3/uL (ref 0.7–3.1)
Lymphs: 34 %
MCH: 27.8 pg (ref 26.6–33.0)
MCHC: 31.4 g/dL — ABNORMAL LOW (ref 31.5–35.7)
MCV: 88 fL (ref 79–97)
Monocytes Absolute: 0.4 10*3/uL (ref 0.1–0.9)
Monocytes: 6 %
Neutrophils Absolute: 3.6 10*3/uL (ref 1.4–7.0)
Neutrophils: 58 %
Platelets: 186 10*3/uL (ref 150–450)
RBC: 4.9 x10E6/uL (ref 3.77–5.28)
RDW: 13.4 % (ref 11.7–15.4)
WBC: 6.3 10*3/uL (ref 3.4–10.8)

## 2023-01-28 LAB — TSH: TSH: 5.12 u[IU]/mL — ABNORMAL HIGH (ref 0.450–4.500)

## 2023-01-28 LAB — LIPID PANEL
Chol/HDL Ratio: 5.5 {ratio} — ABNORMAL HIGH (ref 0.0–4.4)
Cholesterol, Total: 232 mg/dL — ABNORMAL HIGH (ref 100–199)
HDL: 42 mg/dL (ref >39)
LDL Chol Calc (NIH): 155 mg/dL — ABNORMAL HIGH (ref 0–99)
Triglycerides: 190 mg/dL — ABNORMAL HIGH (ref 0–149)
VLDL Cholesterol Cal: 35 mg/dL (ref 5–40)

## 2023-01-28 LAB — HEMOGLOBIN A1C
Est. average glucose Bld gHb Est-mCnc: 120 mg/dL
Hgb A1c MFr Bld: 5.8 % — ABNORMAL HIGH (ref 4.8–5.6)

## 2023-01-28 LAB — VITAMIN D 25 HYDROXY (VIT D DEFICIENCY, FRACTURES): Vit D, 25-Hydroxy: 32.1 ng/mL (ref 30.0–100.0)

## 2023-01-28 LAB — VITAMIN B12

## 2023-02-06 NOTE — Progress Notes (Unsigned)
Name: Autumn Long   MRN: [id number]    DOB: 06-06-1965   Date:02/07/2023       Progress Note  Subjective  Chief Complaint  Follow Up  HPI  Left flank pain: going on for years, had CT done earlier 2024 and negative for internal pathology, likely from her back and symptoms are stable  She was seen by Dr. Barnett Abu neurosurgeon after a skull fracture and post-concussion in March 2021. She is feeling much better , MRI last year just showed some scarring. She sates still has intermittent dizziness and headaches. Cognition is back to normal. She has been at work but has accommodations. 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 Stable  Subclinical hypothyroidism/history of Hashimoto's thyroiditis:  last TSH was slightly elevated , we will continue to monitor   Low B12 and vitamin D deficiency: she is taking supplements daily otc. Advised to cut back on B12 to once or twice a week, continue vitamin D supplementation   Plantar fasciitis: she noticed pain on both heels about one month ago, she has been trying ice. Pain is worse when she first gets up of bed. Both feet   Family history of heart disease/Atherosclerosis of aorta:  father died at age 81 from MI, brother had first MI at age 75 and now bypass surgery . 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, initial encounter (HCC) 10/09/2019   Dyslipidemia 10/18/2014   Keratosis pilaris 10/18/2014   Subclinical hypothyroidism 10/18/2014   Vitamin D deficiency 10/18/2014   History of Hashimoto thyroiditis 03/18/2013    Past Surgical History:  Procedure Laterality Date   CESAREAN SECTION  1991   CESAREAN SECTION  1992   COLONOSCOPY WITH PROPOFOL N/A 04/19/2016   Procedure: COLONOSCOPY WITH PROPOFOL;  Surgeon: Christena Deem, MD;  Location: [facility] ENDOSCOPY;  Service: Endoscopy;  Laterality: N/A;    Family History  Problem Relation Age of Onset   Heart disease Father    Cancer Maternal Grandmother        Stomach    Social History   Tobacco Use   Smoking status: Never   Smokeless tobacco: Never  Substance Use Topics   Alcohol use: Yes    Alcohol/week: 0.0 standard drinks of alcohol    Comment: occasionally     Current Outpatient Medications:    cholecalciferol (VITAMIN D) 1000 UNITS tablet, Take 2,000 Units by mouth daily., Disp: , Rfl:    cyanocobalamin (VITAMIN B12) 1000 MCG tablet, Take 1,000 mcg by mouth daily., Disp: , Rfl:    omega-3 acid ethyl esters (LOVAZA) 1 g capsule, Take 2 capsules (2 g total) by mouth 2 (two) times daily., Disp: 360 capsule, Rfl: 3  No Known Allergies  I personally reviewed active problem list, medication list, allergies, family history, social history, health maintenance with the patient/caregiver today.  ROS  Ten systems reviewed and is negative except as mentioned in HPI    Objective  Vitals:   02/07/23 1431  BP: 122/76  Pulse: 66  Resp: 16  SpO2: 99%  Weight: 169 lb (76.7 kg)  Height: 5\' 3"  (1.6 m)    Body mass index is 29.94 kg/m.  Physical Exam  Constitutional: Patient appears well-developed and well-nourished.  No distress.  HEENT: head atraumatic, normocephalic, pupils equal and reactive to light, neck supple Cardiovascular: Normal rate, regular rhythm and normal heart sounds.   No murmur heard. No BLE edema. Pulmonary/Chest: Effort normal and breath sounds normal. No respiratory distress. Muscular skeletal: tender and swollen area on the foot on inner arch near the heel Abdominal: Soft.  There is no tenderness. Psychiatric: Patient has a normal mood and affect. behavior is normal. Judgment and thought content normal.    PHQ2/9:    02/07/2023    2:31 PM 07/26/2022    2:54 PM 05/19/2022    7:51 AM 06/14/2021    3:15 PM 05/20/2021    7:30 AM  Depression screen PHQ 2/9  Decreased Interest 0 0 0 0 0  Down, Depressed, Hopeless 0 0 0 0 0  PHQ - 2 Score 0 0 0 0 0  Altered sleeping 0 0 0 0 0  Tired, decreased energy 0 0 0 0 0  Change in appetite 0 0 0 0 0  Feeling bad or failure about yourself  0 0 0 0 0  Trouble concentrating 0 0 0 0 0  Moving slowly or fidgety/restless 0 0 0 0 0  Suicidal thoughts 0 0 0 0 0  PHQ-9 Score 0 0 0 0 0    phq 9 is negative   Fall Risk:    02/07/2023    2:31 PM 07/26/2022    2:54 PM 05/19/2022    7:50 AM 06/14/2021    3:15 PM 05/20/2021    7:30 AM  Fall Risk   Falls in the past year? 0 0 0 0 0  Number falls in past yr: 0  0 0 0  Injury with Fall? 0  0 0 0  Risk for fall due to : No Fall Risks No Fall Risks No Fall Risks No Fall Risks No Fall Risks  Follow up Falls prevention discussed Falls prevention discussed;Education provided;Falls evaluation completed Falls prevention discussed Falls prevention discussed Falls prevention discussed      Functional Status Survey: Is the patient deaf or have difficulty hearing?: No Does the patient have difficulty seeing, even when wearing glasses/contacts?: No Does the patient have difficulty concentrating, remembering, or making decisions?: No Does the patient have difficulty walking or climbing stairs?: No Does the patient have difficulty dressing or bathing?: No Does the patient have difficulty doing errands alone such as visiting a doctor's office or shopping?: No    Assessment &  Plan  1. Atherosclerosis of abdominal aorta (HCC)  - rosuvastatin (CRESTOR) 10 MG tablet; Take 1 tablet (10 mg total) by mouth daily.  Dispense: 90 tablet; Refill: 1  2. History of Hashimoto thyroiditis  TSH stable   3. Plantar fasciitis  - celecoxib (CELEBREX) 100 MG capsule; Take 1 capsule (100 mg total) by mouth 2 (two) times daily.  Dispense: 60 capsule; Refill: 1  4. Need for immunization against influenza  - Flu vaccine trivalent PF, 6mos and older(Flulaval,Afluria,Fluarix,Fluzone)  5. Vitamin D deficiency  Improved   6. B12 deficiency  She needs to back down on supplements   7.  Pre-diabetes  A1C improved  8. Dyslipidemia  - rosuvastatin (CRESTOR) 10 MG tablet; Take 1 tablet (10 mg total) by mouth daily.  Dispense: 90 tablet; Refill: 1

## 2023-02-07 ENCOUNTER — Ambulatory Visit: Payer: BC Managed Care – PPO | Admitting: Family Medicine

## 2023-02-07 ENCOUNTER — Encounter: Payer: Self-pay | Admitting: Family Medicine

## 2023-02-07 VITALS — BP 122/76 | HR 66 | Resp 16 | Ht 63.0 in | Wt 169.0 lb

## 2023-02-07 DIAGNOSIS — R7303 Prediabetes: Secondary | ICD-10-CM

## 2023-02-07 DIAGNOSIS — Z1231 Encounter for screening mammogram for malignant neoplasm of breast: Secondary | ICD-10-CM

## 2023-02-07 DIAGNOSIS — M722 Plantar fascial fibromatosis: Secondary | ICD-10-CM | POA: Insufficient documentation

## 2023-02-07 DIAGNOSIS — Z8639 Personal history of other endocrine, nutritional and metabolic disease: Secondary | ICD-10-CM | POA: Diagnosis not present

## 2023-02-07 DIAGNOSIS — E559 Vitamin D deficiency, unspecified: Secondary | ICD-10-CM

## 2023-02-07 DIAGNOSIS — E538 Deficiency of other specified B group vitamins: Secondary | ICD-10-CM

## 2023-02-07 DIAGNOSIS — E785 Hyperlipidemia, unspecified: Secondary | ICD-10-CM

## 2023-02-07 DIAGNOSIS — Z23 Encounter for immunization: Secondary | ICD-10-CM | POA: Diagnosis not present

## 2023-02-07 DIAGNOSIS — I700 Atherosclerosis of aorta: Secondary | ICD-10-CM

## 2023-02-07 MED ORDER — ROSUVASTATIN CALCIUM 10 MG PO TABS
10.0000 mg | ORAL_TABLET | Freq: Every day | ORAL | 0 refills | Status: DC
Start: 2023-02-07 — End: 2023-05-04
  Filled 2023-05-05: qty 90, 90d supply, fill #0

## 2023-02-07 MED ORDER — CELECOXIB 100 MG PO CAPS: 100.0000 mg | ORAL_CAPSULE | Freq: Two times a day (BID) | ORAL | 1 refills | Status: DC

## 2023-02-07 NOTE — Patient Instructions (Signed)
OOFOS sandals  

## 2023-04-06 ENCOUNTER — Ambulatory Visit: Payer: No Typology Code available for payment source | Admitting: Physician Assistant

## 2023-05-04 ENCOUNTER — Encounter: Payer: Self-pay | Admitting: Family Medicine

## 2023-05-04 ENCOUNTER — Telehealth: Payer: Self-pay | Admitting: Family Medicine

## 2023-05-04 ENCOUNTER — Other Ambulatory Visit: Payer: Self-pay | Admitting: Family Medicine

## 2023-05-04 DIAGNOSIS — I700 Atherosclerosis of aorta: Secondary | ICD-10-CM

## 2023-05-04 DIAGNOSIS — E785 Hyperlipidemia, unspecified: Secondary | ICD-10-CM

## 2023-05-04 NOTE — Telephone Encounter (Signed)
 Patient called to see if provider would write a script for a medication that cost less but is equivalent to the omega-3 acid ethyl esters (LOVAZA) 1 g capsule. Please f/u with patient

## 2023-05-04 NOTE — Telephone Encounter (Signed)
 Medication Refill -  Most Recent Primary Care Visit:  Provider: GLENARD MIRE  Department: [facility]  Visit Type: OFFICE VISIT  Date: 02/07/2023  Medication: rosuvastatin  (CRESTOR ) 10 MG tablet   Has the patient contacted their pharmacy? No  Is this the correct pharmacy for this prescription? Yes  This is the patient's preferred pharmacy:  [facility] Phone: [phone]  Fax: [phone]      Has the prescription been filled recently? Yes  Is the patient out of the medication? Yes  Has the patient been seen for an appointment in the last year OR does the patient have an upcoming appointment? Yes  Can we respond through MyChart? No  Agent: Please be advised that Rx refills may take up to 3 business days. We ask that you follow-up with your pharmacy.

## 2023-05-05 ENCOUNTER — Other Ambulatory Visit: Payer: Self-pay

## 2023-05-05 ENCOUNTER — Other Ambulatory Visit: Payer: Self-pay | Admitting: Family Medicine

## 2023-05-05 DIAGNOSIS — E785 Hyperlipidemia, unspecified: Secondary | ICD-10-CM

## 2023-05-05 MED ORDER — OMEGA-3-ACID ETHYL ESTERS 1 G PO CAPS
2.0000 | ORAL_CAPSULE | Freq: Two times a day (BID) | ORAL | 3 refills | Status: DC
Start: 2023-05-05 — End: 2023-08-08
  Filled 2023-05-05: qty 360, 90d supply, fill #0

## 2023-05-08 ENCOUNTER — Other Ambulatory Visit: Payer: Self-pay

## 2023-05-08 MED ORDER — ROSUVASTATIN CALCIUM 10 MG PO TABS
10.0000 mg | ORAL_TABLET | Freq: Every day | ORAL | 1 refills | Status: DC
Start: 2023-05-08 — End: 2023-08-08
  Filled 2023-05-08: qty 90, 90d supply, fill #0
  Filled 2023-07-29: qty 90, 90d supply, fill #1

## 2023-05-08 NOTE — Telephone Encounter (Signed)
 Requested Prescriptions  Pending Prescriptions Disp Refills   rosuvastatin  (CRESTOR ) 10 MG tablet 90 tablet 1    Sig: Take 1 tablet (10 mg total) by mouth daily.     Cardiovascular:  Antilipid - Statins 2 Failed - 05/08/2023 10:54 AM      Failed - Lipid Panel in normal range within the last 12 months    Cholesterol, Total  Date Value Ref Range Status  01/26/2023 232 (H) 100 - 199 mg/dL Final   LDL Chol Calc (NIH)  Date Value Ref Range Status  01/26/2023 155 (H) 0 - 99 mg/dL Final   HDL  Date Value Ref Range Status  01/26/2023 42 >39 mg/dL Final   Triglycerides  Date Value Ref Range Status  01/26/2023 190 (H) 0 - 149 mg/dL Final         Passed - Cr in normal range and within 360 days    Creatinine, Ser  Date Value Ref Range Status  01/26/2023 0.72 0.57 - 1.00 mg/dL Final         Passed - Patient is not pregnant      Passed - Valid encounter within last 12 months    Recent Outpatient Visits           3 months ago Atherosclerosis of abdominal aorta [facility])   [facility] Glenard Mire, MD   9 months ago Well adult exam   [facility] Glenard Mire, MD   11 months ago Dyslipidemia   [facility] Sowles, Krichna, MD   1 year ago Dysuria   [facility] Sowles, Krichna, MD   1 year ago Left groin pain   [facility] Glenard Mire, MD       Future Appointments             In 2 months Glenard, Krichna, MD [facility]

## 2023-07-21 ENCOUNTER — Ambulatory Visit: Payer: Self-pay

## 2023-07-21 NOTE — Telephone Encounter (Signed)
  Chief Complaint: flank/abd pain Symptoms: flank and abd pain Frequency: a few weeks Pertinent Negatives: Patient denies urinary symptoms Disposition: [] ED /[] Urgent Care (no appt availability in office) / [x] Appointment(In office/virtual)/ []  [facility]/ [] Home Care/ [] Refused Recommended Disposition /[] [facility]/ []  Follow-up with PCP Additional Notes: Patient states that she has been having flank, back and lower left abd pain off and on for a few weeks. Pain is a dull ache that comes and goes.  Offered appt today but only wants to come next week.   Copied from CRM [id number]. Topic: Clinical - Red Word Triage >> Jul 21, 2023  8:37 AM Gaetano Hawthorne wrote: [facility] that prompted transfer to Nurse Triage: pain in the left side of lower back and abdomen pain - patient described it as off and on and dull pains - this has been occurring for the last few weeks. Reason for Disposition  [1] MILD pain (i.e., scale 1-3; does not interfere with normal activities) AND [2] present > 3 days  Answer Assessment - Initial Assessment Questions 1. LOCATION: "Where does it hurt?" (e.g., left, right)     left 2. ONSET: "When did the pain start?"  A few weeks ago 3. SEVERITY: "How bad is the pain?" (e.g., Scale 1-10; mild, moderate, or severe)   - MILD (1-3): doesn't interfere with normal activities    - MODERATE (4-7): interferes with normal activities or awakens from sleep    - SEVERE (8-10): excruciating pain and patient unable to do normal activities (stays in bed)       56-/10 4. PATTERN: "Does the pain come and go, or is it constant?"      Comes and goes 5. CAUSE: "What do you think is causing the pain?"     unknown 6. OTHER SYMPTOMS:  "Do you have any other symptoms?" (e.g., fever, abdomen pain, vomiting, leg weakness, burning with urination, blood in urine)     Dull/aching pain left abd towards side/back  Protocols used: Flank Pain-A-AH

## 2023-07-27 ENCOUNTER — Ambulatory Visit (INDEPENDENT_AMBULATORY_CARE_PROVIDER_SITE_OTHER): Admitting: Internal Medicine

## 2023-07-27 ENCOUNTER — Other Ambulatory Visit: Payer: Self-pay

## 2023-07-27 ENCOUNTER — Encounter: Payer: Self-pay | Admitting: Internal Medicine

## 2023-07-27 ENCOUNTER — Telehealth: Payer: Self-pay | Admitting: Family Medicine

## 2023-07-27 ENCOUNTER — Other Ambulatory Visit: Payer: Self-pay | Admitting: Family Medicine

## 2023-07-27 VITALS — BP 124/72 | HR 66 | Temp 97.5°F | Resp 16 | Ht 63.0 in | Wt 168.4 lb

## 2023-07-27 DIAGNOSIS — R109 Unspecified abdominal pain: Secondary | ICD-10-CM | POA: Diagnosis not present

## 2023-07-27 DIAGNOSIS — E538 Deficiency of other specified B group vitamins: Secondary | ICD-10-CM

## 2023-07-27 DIAGNOSIS — Z79899 Other long term (current) drug therapy: Secondary | ICD-10-CM

## 2023-07-27 DIAGNOSIS — E781 Pure hyperglyceridemia: Secondary | ICD-10-CM

## 2023-07-27 DIAGNOSIS — E038 Other specified hypothyroidism: Secondary | ICD-10-CM

## 2023-07-27 DIAGNOSIS — R7303 Prediabetes: Secondary | ICD-10-CM

## 2023-07-27 DIAGNOSIS — E785 Hyperlipidemia, unspecified: Secondary | ICD-10-CM

## 2023-07-27 DIAGNOSIS — E559 Vitamin D deficiency, unspecified: Secondary | ICD-10-CM

## 2023-07-27 DIAGNOSIS — Z0000 Encounter for general adult medical examination without abnormal findings: Secondary | ICD-10-CM

## 2023-07-27 LAB — POCT URINALYSIS DIPSTICK
Bilirubin, UA: NEGATIVE
Glucose, UA: NEGATIVE
Ketones, UA: NEGATIVE
Nitrite, UA: NEGATIVE
Odor: NEGATIVE
Protein, UA: POSITIVE — AB
Spec Grav, UA: 1.02 (ref 1.010–1.025)
Urobilinogen, UA: 0.2 U/dL
pH, UA: 5 (ref 5.0–8.0)

## 2023-07-27 NOTE — Progress Notes (Signed)
 Acute Office Visit  Subjective:     Patient ID: Autumn Long, female    DOB: 1965-11-20, 58 y.o.   MRN: [id number]  Chief Complaint  Patient presents with   Flank Pain    Dull pain left side of stomach for 3 weeks     HPI Patient is in today for left flank and LLQ pain.   Discussed the use of AI scribe software for clinical note transcription with the patient, who gave verbal consent to proceed.  History of Present Illness The patient, with a history of uterine fibroids and mild fatty liver, presents with intermittent left-sided abdominal pain that has been ongoing for about two years. The pain is described as low in intensity and is located in the left lower quadrant, sometimes radiating to other areas on the left side. The patient reports no significant changes in bowel or urinary habits and denies any associated symptoms such as diarrhea, constipation, or urinary changes. The patient has had previous imaging studies, including a CT scan two years ago, which revealed a uterine fibroid and mild fatty liver but no significant findings related to the current complaint. The patient has not taken any specific medications for the pain but reports some relief with over-the-counter analgesics.     Review of Systems  Constitutional:  Negative for chills and fever.  Gastrointestinal:  Positive for abdominal pain. Negative for blood in stool, constipation, diarrhea, melena, nausea and vomiting.  Genitourinary:  Positive for flank pain. Negative for dysuria, frequency, hematuria and urgency.        Objective:    BP 124/72 (Cuff Size: Normal)   Pulse 66   Temp (!) 97.5 F (36.4 C) (Oral)   Resp 16   Ht 5\' 3"  (1.6 m)   Wt 168 lb 6.4 oz (76.4 kg)   SpO2 100%   BMI 29.83 kg/m    Physical Exam Constitutional:      Appearance: Normal appearance.  HENT:     Head: Normocephalic and atraumatic.  Eyes:     Conjunctiva/sclera: Conjunctivae normal.  Cardiovascular:     Rate  and Rhythm: Normal rate and regular rhythm.  Pulmonary:     Effort: Pulmonary effort is normal.     Breath sounds: Normal breath sounds.  Abdominal:     General: Bowel sounds are normal. There is no distension.     Palpations: Abdomen is soft. There is no mass.     Tenderness: There is no abdominal tenderness. There is no right CVA tenderness, left CVA tenderness, guarding or rebound.     Hernia: No hernia is present.  Skin:    General: Skin is warm and dry.  Neurological:     General: No focal deficit present.     Mental Status: She is alert. Mental status is at baseline.  Psychiatric:        Mood and Affect: Mood normal.        Behavior: Behavior normal.     No results found for any visits on 07/27/23.      Assessment & Plan:   Assessment and Plan Assessment & Plan Musculoskeletal pain Intermittent left-sided pain likely musculoskeletal, possibly related to posture or muscle strain. No severe conditions suspected. Conservative management emphasized. - Perform urine test to rule out urinary issues. - Advise use of moist heat or heating pad. - Recommend topical analgesics like Voltaren or [place]. - Continue acetaminophen and ibuprofen as needed. - Encourage posture improvement and gentle stretching. - Perform urine test during  current visit. - Advise reporting persistent pain during upcoming physical examination.   Return for already scheduled.  Margarita Mail, DO

## 2023-07-27 NOTE — Telephone Encounter (Signed)
 Pt has her physical scheduled for April 8. She is asking Dr Autumn Long to do her labs before her appt. She is wnating the usual that Dr Autumn Long check (lipid, thyroid, comprehensive etc.). is asking to do her labs on Monday. Please call when order is ready

## 2023-07-27 NOTE — Telephone Encounter (Signed)
 Will notify patient through [facility]

## 2023-07-27 NOTE — Telephone Encounter (Signed)
 Copied from CRM [id number]. Topic: General - Other >> Jul 27, 2023 11:08 AM Emylou G wrote: Reason for CRM: Pls call patient in regards to upcoming physical and bloodwork [phone]

## 2023-07-28 ENCOUNTER — Encounter: Payer: Self-pay | Admitting: Internal Medicine

## 2023-07-28 LAB — URINE CULTURE
MICRO NUMBER:: 16256836
SPECIMEN QUALITY:: ADEQUATE

## 2023-07-31 ENCOUNTER — Other Ambulatory Visit: Payer: Self-pay

## 2023-07-31 DIAGNOSIS — R109 Unspecified abdominal pain: Secondary | ICD-10-CM

## 2023-08-01 ENCOUNTER — Encounter: Payer: Self-pay | Admitting: Family Medicine

## 2023-08-01 LAB — HEMOGLOBIN A1C
Est. average glucose Bld gHb Est-mCnc: 123 mg/dL
Hgb A1c MFr Bld: 5.9 % — ABNORMAL HIGH (ref 4.8–5.6)

## 2023-08-01 LAB — COMPREHENSIVE METABOLIC PANEL WITH GFR
ALT: 29 IU/L (ref 0–32)
AST: 22 IU/L (ref 0–40)
Albumin: 4.8 g/dL (ref 3.8–4.9)
Alkaline Phosphatase: 81 IU/L (ref 44–121)
BUN/Creatinine Ratio: 26 — ABNORMAL HIGH (ref 9–23)
BUN: 19 mg/dL (ref 6–24)
Bilirubin Total: 0.5 mg/dL (ref 0.0–1.2)
CO2: 22 mmol/L (ref 20–29)
Calcium: 9.7 mg/dL (ref 8.7–10.2)
Chloride: 101 mmol/L (ref 96–106)
Creatinine, Ser: 0.74 mg/dL (ref 0.57–1.00)
Globulin, Total: 2.8 g/dL (ref 1.5–4.5)
Glucose: 109 mg/dL — ABNORMAL HIGH (ref 70–99)
Potassium: 4.6 mmol/L (ref 3.5–5.2)
Sodium: 139 mmol/L (ref 134–144)
Total Protein: 7.6 g/dL (ref 6.0–8.5)
eGFR: 94 mL/min/{1.73_m2} (ref >59)

## 2023-08-01 LAB — LIPID PANEL
Chol/HDL Ratio: 3.2 ratio (ref 0.0–4.4)
Cholesterol, Total: 163 mg/dL (ref 100–199)
HDL: 51 mg/dL (ref >39)
LDL Chol Calc (NIH): 85 mg/dL (ref 0–99)
Triglycerides: 158 mg/dL — ABNORMAL HIGH (ref 0–149)
VLDL Cholesterol Cal: 27 mg/dL (ref 5–40)

## 2023-08-01 LAB — B12 AND FOLATE PANEL
Folate: 14.7 ng/mL (ref >3.0)
Vitamin B-12: 823 pg/mL (ref 232–1245)

## 2023-08-01 LAB — CBC WITH DIFFERENTIAL/PLATELET
Basophils Absolute: 0 10*3/uL (ref 0.0–0.2)
Basos: 1 %
EOS (ABSOLUTE): 0.1 10*3/uL (ref 0.0–0.4)
Eos: 1 %
Hematocrit: 43.1 % (ref 34.0–46.6)
Hemoglobin: 14.1 g/dL (ref 11.1–15.9)
Immature Grans (Abs): 0 10*3/uL (ref 0.0–0.1)
Immature Granulocytes: 0 %
Lymphocytes Absolute: 2.1 10*3/uL (ref 0.7–3.1)
Lymphs: 35 %
MCH: 28.5 pg (ref 26.6–33.0)
MCHC: 32.7 g/dL (ref 31.5–35.7)
MCV: 87 fL (ref 79–97)
Monocytes Absolute: 0.3 10*3/uL (ref 0.1–0.9)
Monocytes: 6 %
Neutrophils Absolute: 3.5 10*3/uL (ref 1.4–7.0)
Neutrophils: 57 %
Platelets: 161 10*3/uL (ref 150–450)
RBC: 4.95 x10E6/uL (ref 3.77–5.28)
RDW: 13.5 % (ref 11.7–15.4)
WBC: 5.9 10*3/uL (ref 3.4–10.8)

## 2023-08-01 LAB — URINE CULTURE
MICRO NUMBER:: 16269016
Result:: NO GROWTH
SPECIMEN QUALITY:: ADEQUATE

## 2023-08-01 LAB — TSH: TSH: 4.07 u[IU]/mL (ref 0.450–4.500)

## 2023-08-01 LAB — VITAMIN D 25 HYDROXY (VIT D DEFICIENCY, FRACTURES): Vit D, 25-Hydroxy: 23.6 ng/mL — ABNORMAL LOW (ref 30.0–100.0)

## 2023-08-03 ENCOUNTER — Encounter: Payer: Self-pay | Admitting: Internal Medicine

## 2023-08-03 ENCOUNTER — Encounter: Payer: BC Managed Care – PPO | Admitting: Family Medicine

## 2023-08-08 ENCOUNTER — Ambulatory Visit (INDEPENDENT_AMBULATORY_CARE_PROVIDER_SITE_OTHER): Payer: BC Managed Care – PPO | Admitting: Family Medicine

## 2023-08-08 ENCOUNTER — Other Ambulatory Visit: Payer: Self-pay

## 2023-08-08 ENCOUNTER — Encounter: Payer: Self-pay | Admitting: Family Medicine

## 2023-08-08 VITALS — BP 134/76 | HR 66 | Resp 16 | Ht 63.0 in | Wt 167.1 lb

## 2023-08-08 DIAGNOSIS — J0100 Acute maxillary sinusitis, unspecified: Secondary | ICD-10-CM

## 2023-08-08 DIAGNOSIS — E2839 Other primary ovarian failure: Secondary | ICD-10-CM

## 2023-08-08 DIAGNOSIS — Z13820 Encounter for screening for osteoporosis: Secondary | ICD-10-CM

## 2023-08-08 DIAGNOSIS — Z0001 Encounter for general adult medical examination with abnormal findings: Secondary | ICD-10-CM | POA: Diagnosis not present

## 2023-08-08 DIAGNOSIS — J301 Allergic rhinitis due to pollen: Secondary | ICD-10-CM | POA: Diagnosis not present

## 2023-08-08 DIAGNOSIS — E538 Deficiency of other specified B group vitamins: Secondary | ICD-10-CM

## 2023-08-08 DIAGNOSIS — E038 Other specified hypothyroidism: Secondary | ICD-10-CM

## 2023-08-08 DIAGNOSIS — I700 Atherosclerosis of aorta: Secondary | ICD-10-CM | POA: Diagnosis not present

## 2023-08-08 DIAGNOSIS — R7303 Prediabetes: Secondary | ICD-10-CM

## 2023-08-08 DIAGNOSIS — E785 Hyperlipidemia, unspecified: Secondary | ICD-10-CM

## 2023-08-08 DIAGNOSIS — R42 Dizziness and giddiness: Secondary | ICD-10-CM

## 2023-08-08 DIAGNOSIS — E559 Vitamin D deficiency, unspecified: Secondary | ICD-10-CM

## 2023-08-08 DIAGNOSIS — Z0000 Encounter for general adult medical examination without abnormal findings: Secondary | ICD-10-CM

## 2023-08-08 MED ORDER — AZITHROMYCIN 500 MG PO TABS
500.0000 mg | ORAL_TABLET | Freq: Every day | ORAL | 0 refills | Status: DC
  Filled 2023-08-08: qty 3, 3d supply, fill #0

## 2023-08-08 MED ORDER — ROSUVASTATIN CALCIUM 10 MG PO TABS
10.0000 mg | ORAL_TABLET | Freq: Every day | ORAL | 1 refills | Status: DC
  Filled 2023-08-08 – 2023-10-09 (×2): qty 90, 90d supply, fill #0
  Filled 2024-01-28: qty 90, 90d supply, fill #1

## 2023-08-08 MED ORDER — FLUTICASONE PROPIONATE 50 MCG/ACT NA SUSP
2.0000 | Freq: Every day | NASAL | 1 refills | Status: DC
  Filled 2023-08-08: qty 16, 30d supply, fill #0

## 2023-08-08 MED ORDER — FENOFIBRATE 145 MG PO TABS
145.0000 mg | ORAL_TABLET | Freq: Every day | ORAL | 1 refills | Status: DC
  Filled 2023-08-08: qty 90, 90d supply, fill #0
  Filled 2023-10-09 – 2023-10-16 (×2): qty 90, 90d supply, fill #1

## 2023-08-08 MED ORDER — PREDNISONE 10 MG PO TABS
10.0000 mg | ORAL_TABLET | Freq: Two times a day (BID) | ORAL | 0 refills | Status: DC
  Filled 2023-08-08: qty 10, 5d supply, fill #0

## 2023-08-08 NOTE — Progress Notes (Signed)
 Name: Autumn Long   MRN: [id number]    DOB: 1965/09/16   Date:08/08/2023       Progress Note  Subjective  Chief Complaint  Chief Complaint  Patient presents with   Annual Exam    HPI  Patient presents for annual CPE and follow up -she is aware it may cost her extra fees due to split visit .  Discussed the use of AI scribe software for clinical note transcription with the patient, who gave verbal consent to proceed.  History of Present Illness   She has been experiencing facial pain and dizziness for the past two months. The facial pain is described as a heavy sensation around the face, particularly affecting the eye and forehead areas, and is associated with a feeling of pressure in the ears. No headaches on the head, but she notes headaches around the face area. The dizziness is described as lightheadedness and a sensation of spinning, which worsens with bright lights and certain movements. She mentions having a runny nose without mucus. Two months ago, she experienced a significant episode of dizziness while at school, feeling as though she might fall. She managed to drive home and took 95,284 units of vitamin D to see if symptoms would improve  She has a history of a concussion from a work-related injury in March 2021, with the last MRI in 2023 showing some scarring. She has not been diagnosed with migraines but has considered the possibility after consulting an AI tool.  Her vitamin D level was 23.6, and she has been taking 1,000 units daily. She is not currently taking Celebrex. She has not been taking any regular medication for allergies or sinus issues, though she took Claritin last week without relief. She has not used Flonase  Her past medical history includes subclinical hypothyroidism, which is normalizing, and prediabetes with a history of elevated triglycerides. She follows a diet rich in fruits, vegetables, and whole grains, with limited rice and sweets. She takes  rosuvastatin 10 mg and Lovaza, though she finds Lovaza expensive and asked to change therapy. She has a history of atherosclerosis and dyslipidemia, and her cholesterol and triglycerides have improved with treatment.  She is postmenopausal with no history of hysterectomy and experiences some dryness during intercourse, which she manages with lubrication. She has no bladder issues and is up to date with her mammogram and colonoscopy. She is physically active, engaging in walking and exercises, and her vision and dental check-ups are current.       Diet: balanced  Exercise: continue regular activity   Last Eye Exam: completed Last Dental Exam: completed  Flowsheet Row Office Visit from 08/08/2023 in [facility]  AUDIT-C Score 2      Depression: Phq 9 is  negative    07/27/2023    8:55 AM 02/07/2023    2:31 PM 07/26/2022    2:54 PM 05/19/2022    7:51 AM 06/14/2021    3:15 PM  Depression screen PHQ 2/9  Decreased Interest 0 0 0 0 0  Down, Depressed, Hopeless 0 0 0 0 0  PHQ - 2 Score 0 0 0 0 0  Altered sleeping  0 0 0 0  Tired, decreased energy  0 0 0 0  Change in appetite  0 0 0 0  Feeling bad or failure about yourself   0 0 0 0  Trouble concentrating  0 0 0 0  Moving slowly or fidgety/restless  0 0 0 0  Suicidal thoughts  0 0 0 0  PHQ-9 Score  0 0 0 0   Hypertension: BP Readings from Last 3 Encounters:  08/08/23 134/76  07/27/23 124/72  02/07/23 122/76   Obesity: Wt Readings from Last 3 Encounters:  08/08/23 167 lb 1.6 oz (75.8 kg)  07/27/23 168 lb 6.4 oz (76.4 kg)  02/07/23 169 lb (76.7 kg)   BMI Readings from Last 3 Encounters:  08/08/23 29.60 kg/m  07/27/23 29.83 kg/m  02/07/23 29.94 kg/m     Vaccines: reviewed with the patient.   Hep C Screening: completed STD testing and prevention (HIV/chl/gon/syphilis): N/A Intimate partner violence: negative screen  Sexual History : she has some vaginal dryness  Menstrual History/LMP/Abnormal  Bleeding: Post menopausal  Discussed importance of follow up if any post-menopausal bleeding: yes  Incontinence Symptoms: negative for symptoms   Breast cancer:  - Last Mammogram: up to date  - BRCA gene screening: N/A  Osteoporosis Prevention : Discussed high calcium and vitamin D supplementation, weight bearing exercises Bone density :yes   Cervical cancer screening: up-to-date  Skin cancer: Discussed monitoring for atypical lesions  Colorectal cancer: up to date    Lung cancer:  Low Dose CT Chest recommended if Age 100-80 years, 20 pack-year currently smoking OR have quit w/in 15years. Patient does not qualify for screen   ECG: 2021   Advanced Care Planning: A voluntary discussion about advance care planning including the explanation and discussion of advance directives.  Discussed health care proxy and Living will, and the patient was able to identify a health care proxy as husband .  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, initial encounter (HCC) 10/09/2019   Dyslipidemia 10/18/2014   Keratosis pilaris 10/18/2014   Subclinical hypothyroidism 10/18/2014   Vitamin D deficiency 10/18/2014   History of Hashimoto thyroiditis 03/18/2013    Past Surgical History:  Procedure Laterality Date   CESAREAN SECTION  1991   CESAREAN SECTION  1992    COLONOSCOPY WITH PROPOFOL N/A 04/19/2016   Procedure: COLONOSCOPY WITH PROPOFOL;  Surgeon: Christena Deem, MD;  Location: [facility] ENDOSCOPY;  Service: Endoscopy;  Laterality: N/A;    Family History  Problem Relation Age of Onset   Heart disease Father    Cancer Maternal Grandmother        Stomach    Social History   Socioeconomic History   Marital status: Married    Spouse name: Fate Galanti   Number of children: 2   Years of education: 52   Highest education level: Bachelor's degree (e.g., BA, AB, BS)  Occupational History   Not on file  Tobacco Use   Smoking status: Never   Smokeless tobacco: Never  Vaping Use   Vaping status: Never Used  Substance and Sexual Activity   Alcohol use: Yes    Alcohol/week: 2.0 standard drinks of alcohol    Types: 2 Standard drinks or equivalent per week  Comment: occasionally   Drug use: No   Sexual activity: Yes    Partners: Male  Other Topics Concern   Not on file  Social History Narrative   Teaches Math   Social Drivers of Health   Financial Resource Strain: Low Risk  (08/08/2023)   Overall Financial Resource Strain (CARDIA)    Difficulty of Paying Living Expenses: Not hard at all  Food Insecurity: No Food Insecurity (08/08/2023)   Hunger Vital Sign    Worried About Running Out of Food in the Last Year: Never true    Ran Out of Food in the Last Year: Never true  Transportation Needs: No Transportation Needs (08/08/2023)   PRAPARE - Administrator, Civil Service (Medical): No    Lack of Transportation (Non-Medical): No  Physical Activity: Insufficiently Active (08/08/2023)   Exercise Vital Sign    Days of Exercise per Week: 4 days    Minutes of Exercise per Session: 20 min  Stress: No Stress Concern Present (08/08/2023)   [employer] of Occupational Health - Occupational Stress Questionnaire    Feeling of Stress : Only a little  Social Connections: Moderately Integrated (08/08/2023)   Social Connection and  Isolation Panel [NHANES]    Frequency of Communication with Friends and Family: More than three times a week    Frequency of Social Gatherings with Friends and Family: More than three times a week    Attends Religious Services: Never    Database administrator or Organizations: Yes    Attends Banker Meetings: 1 to 4 times per year    Marital Status: Married  Catering manager Violence: Not At Risk (08/08/2023)   Humiliation, Afraid, Rape, and Kick questionnaire    Fear of Current or Ex-Partner: No    Emotionally Abused: No    Physically Abused: No    Sexually Abused: No     Current Outpatient Medications:    cholecalciferol (VITAMIN D) 1000 UNITS tablet, Take 2,000 Units by mouth daily., Disp: , Rfl:    cyanocobalamin (VITAMIN B12) 1000 MCG tablet, Take 1,000 mcg by mouth daily., Disp: , Rfl:    omega-3 acid ethyl esters (LOVAZA) 1 g capsule, Take 2 capsules (2 g total) by mouth 2 (two) times daily., Disp: 360 capsule, Rfl: 3   rosuvastatin (CRESTOR) 10 MG tablet, Take 1 tablet (10 mg total) by mouth daily., Disp: 90 tablet, Rfl: 1   celecoxib (CELEBREX) 100 MG capsule, Take 1 capsule (100 mg total) by mouth 2 (two) times daily. (Patient not taking: Reported on 08/08/2023), Disp: 60 capsule, Rfl: 1  No Known Allergies   ROS  Constitutional: Negative for fever or weight change.  Respiratory: Negative for cough and shortness of breath.   Cardiovascular: Negative for chest pain or palpitations.  Gastrointestinal: Negative for abdominal pain, no bowel changes.  Musculoskeletal: Negative for gait problem or joint swelling.  Skin: Negative for rash.  Neurological: positive  for dizziness or headache.  No other specific complaints in a complete review of systems (except as listed in HPI above).   Objective  Vitals:   08/08/23 1425  BP: 134/76  Pulse: 66  Resp: 16  SpO2: 94%  Weight: 167 lb 1.6 oz (75.8 kg)  Height: 5\' 3"  (1.6 m)    Body mass index is 29.6  kg/m.  Physical Exam  Constitutional: Patient appears well-developed and well-nourished. No distress.  HENT: Head: Normocephalic and atraumatic. Ears: B TMs ok, no erythema or effusion; Nose: Nose normal.  Mouth/Throat: Oropharynx is clear and moist. No oropharyngeal exudate.  Eyes: Conjunctivae and EOM are normal. Pupils are equal, round, and reactive to light. No scleral icterus.  Neck: Normal range of motion. Neck supple. No JVD present. No thyromegaly present.  Cardiovascular: Normal rate, regular rhythm and normal heart sounds.  No murmur heard. No BLE edema. Pulmonary/Chest: Effort normal and breath sounds normal. No respiratory distress. Abdominal: Soft. Bowel sounds are normal, no distension. There is no tenderness. no masses Breast: no lumps or masses, no nipple discharge or rashes FEMALE GENITALIA:  Not done  RECTAL: not done  Musculoskeletal: Normal range of motion, no joint effusions. No gross deformities Neurological: he is alert and oriented to person, place, and time. No cranial nerve deficit. Coordination, balance, strength, speech and gait are normal.  Skin: Skin is warm and dry. No rash noted. No erythema.  Psychiatric: Patient has a normal mood and affect. behavior is normal. Judgment and thought content normal.     Assessment & Plan  Assessment and Plan Assessment & Plan Sinusitis Symptoms suggest sinusitis. Treated with antibiotics and prednisone. Advised Flonase and Claritin for allergies. Consider ENT referral if unresolved. - Prescribe azithromycin 500 mg for 3 days. - Prescribe prednisone twice a day for 5 days, with food, not before bedtime. - Recommend over-the-counter Flonase and Claritin. - Consider ENT referral if symptoms persist.  Vestibular Migraine Dizziness may indicate vestibular migraine. Consider neurologist referral if unresolved post-sinusitis treatment. - Consider neurologist referral if symptoms persist post-sinusitis  treatment.  Hyperlipidemia/Atherosclerosis of abdominal aorta Hyperlipidemia managed with rosuvastatin. Discussed fenofibrate as cost-effective alternative to Lovaza. - Prescribe fenofibrate as alternative to Lovaza. - Continue rosuvastatin 10 mg daily.  Prediabetes Fasting blood sugar and A1c indicate prediabetes. Advised carbohydrate reduction. - Advise reducing carbohydrate intake.  Vitamin D Insufficiency Vitamin D level insufficient. Recommended daily supplementation. - Recommend taking 2000 IU of vitamin D daily.  General Health Maintenance Bone density scan discussed due to subclinical hypothyroidism and postmenopausal status. - Order bone density scan at [facility]. - Continue using lubrication for vaginal dryness.  Follow-up Scheduled follow-up in six months. Discussed potential need for blood work before fenofibrate. - Schedule follow-up appointment in six months. - Consider earlier follow-up if symptoms persist or worsen. - Consider blood work before starting fenofibrate.       -USPSTF grade A and B recommendations reviewed with patient; age-appropriate recommendations, preventive care, screening tests, etc discussed and encouraged; healthy living encouraged; see AVS for patient education given to patient -Discussed importance of 150 minutes of physical activity weekly, eat two servings of fish weekly, eat one serving of tree nuts ( cashews, pistachios, pecans, almonds.Marland Kitchen) every other day, eat 6 servings of fruit/vegetables daily and drink plenty of water and avoid sweet beverages.   -Reviewed Health Maintenance: Yes.

## 2023-08-21 ENCOUNTER — Other Ambulatory Visit: Payer: Self-pay | Admitting: Family Medicine

## 2023-08-21 ENCOUNTER — Other Ambulatory Visit: Payer: Self-pay | Admitting: *Deleted

## 2023-08-21 ENCOUNTER — Inpatient Hospital Stay
Admission: RE | Admit: 2023-08-21 | Discharge: 2023-08-21 | Disposition: A | Discharge status: Home / Self Care | Payer: Self-pay | Source: Ambulatory Visit | Attending: Family Medicine | Admitting: Family Medicine

## 2023-08-21 ENCOUNTER — Other Ambulatory Visit: Payer: Self-pay

## 2023-08-21 DIAGNOSIS — Z1231 Encounter for screening mammogram for malignant neoplasm of breast: Secondary | ICD-10-CM

## 2023-08-21 MED ORDER — FLUTICASONE PROPIONATE 50 MCG/ACT NA SUSP
1.0000 | Freq: Every day | NASAL | 11 refills | Status: DC
  Filled 2023-08-21: qty 16, 30d supply, fill #0

## 2023-08-21 MED ORDER — CETIRIZINE HCL 10 MG PO TABS
10.0000 mg | ORAL_TABLET | Freq: Every evening | ORAL | 3 refills | Status: AC
  Filled 2023-08-21: qty 90, 90d supply, fill #0

## 2023-10-02 ENCOUNTER — Other Ambulatory Visit: Payer: Self-pay

## 2023-10-09 ENCOUNTER — Ambulatory Visit
Admission: RE | Admit: 2023-10-09 | Discharge: 2023-10-09 | Disposition: A | Discharge status: Home / Self Care | Source: Ambulatory Visit | Attending: Family Medicine | Admitting: Family Medicine

## 2023-10-09 ENCOUNTER — Other Ambulatory Visit: Payer: Self-pay

## 2023-10-09 ENCOUNTER — Other Ambulatory Visit (HOSPITAL_COMMUNITY): Payer: Self-pay

## 2023-10-09 DIAGNOSIS — Z13820 Encounter for screening for osteoporosis: Secondary | ICD-10-CM | POA: Diagnosis present

## 2023-10-09 DIAGNOSIS — Z0000 Encounter for general adult medical examination without abnormal findings: Secondary | ICD-10-CM

## 2023-10-09 DIAGNOSIS — Z1231 Encounter for screening mammogram for malignant neoplasm of breast: Secondary | ICD-10-CM | POA: Insufficient documentation

## 2023-10-10 ENCOUNTER — Ambulatory Visit: Payer: Self-pay | Admitting: Family Medicine

## 2023-10-16 ENCOUNTER — Other Ambulatory Visit: Payer: Self-pay

## 2024-02-13 ENCOUNTER — Other Ambulatory Visit: Payer: Self-pay | Admitting: Family Medicine

## 2024-02-13 DIAGNOSIS — E785 Hyperlipidemia, unspecified: Secondary | ICD-10-CM

## 2024-02-14 ENCOUNTER — Other Ambulatory Visit: Payer: Self-pay

## 2024-02-14 NOTE — Telephone Encounter (Signed)
 Appt scheduled with Dr glenard for 02/15/24

## 2024-02-14 NOTE — Telephone Encounter (Signed)
 Pt due for a follow up ASAP please schedule

## 2024-02-15 ENCOUNTER — Ambulatory Visit: Admitting: Family Medicine

## 2024-02-15 ENCOUNTER — Encounter: Payer: Self-pay | Admitting: Family Medicine

## 2024-02-15 VITALS — BP 126/76 | HR 60 | Resp 16 | Ht 63.0 in | Wt 170.2 lb

## 2024-02-15 DIAGNOSIS — I700 Atherosclerosis of aorta: Secondary | ICD-10-CM

## 2024-02-15 DIAGNOSIS — Z23 Encounter for immunization: Secondary | ICD-10-CM | POA: Diagnosis not present

## 2024-02-15 DIAGNOSIS — R7303 Prediabetes: Secondary | ICD-10-CM

## 2024-02-15 DIAGNOSIS — E785 Hyperlipidemia, unspecified: Secondary | ICD-10-CM

## 2024-02-15 DIAGNOSIS — E038 Other specified hypothyroidism: Secondary | ICD-10-CM | POA: Diagnosis not present

## 2024-02-15 DIAGNOSIS — Z8639 Personal history of other endocrine, nutritional and metabolic disease: Secondary | ICD-10-CM

## 2024-02-15 DIAGNOSIS — E538 Deficiency of other specified B group vitamins: Secondary | ICD-10-CM

## 2024-02-15 DIAGNOSIS — F0781 Postconcussional syndrome: Secondary | ICD-10-CM

## 2024-02-15 NOTE — Progress Notes (Signed)
 Name: Autumn Long   MRN: [id number]    DOB: January 28, 1966   Date:02/15/2024       Progress Note  Subjective  Chief Complaint  Chief Complaint  Patient presents with   Medical Management of Chronic Issues   Discussed the use of AI scribe software for clinical note transcription with the patient, who gave verbal consent to proceed.  History of Present Illness Autumn Long is a 58 year old female who presents for a regular follow-up visit.  Her blood pressure at her last visit in April was 134/76 mmHg, and today it is 126/76 mmHg. She does not take any medication for hypertension.  She is currently on rosuvastatin  10 mg and fenofibrate  145 mg daily for dyslipidemia and atherosclerosis of the aorta. Her LDL cholesterol has decreased from 166 mg/dL to 85 mg/dL, and her triglycerides have reduced from 190 mg/dL to 841 mg/dL. She previously took fish oil, but discontinued it due to insurance coverage issues. She maintains a diet low in fried foods and carbohydrates, although she still consumes rice and prefers whole wheat bread.  Her A1c has decreased from 6% to the 5% range. She reports reducing carbohydrate intake and increasing physical activity.  She has a history of subclinical hypothyroidism , last TSH level was towards high end of normal, improving . Her vitamin D  level was low, and she is taking 2000 IU daily. Her folic acid and B12 levels are excellent.  She experienced a diffuse traumatic brain injury with loss of consciousness in 2021, resulting in post-concussion syndrome. She has occasional dizziness and headaches, which are less severe now. She takes Thursdays off as a rest day and has undergone vestibular therapy for balance issues.    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, initial encounter (HCC) 10/09/2019   Dyslipidemia 10/18/2014   Keratosis pilaris 10/18/2014   Subclinical hypothyroidism 10/18/2014   Vitamin D  deficiency 10/18/2014   History of Hashimoto thyroiditis 03/18/2013    Past Surgical History:  Procedure Laterality Date   CESAREAN SECTION  1991   CESAREAN SECTION  1992   COLONOSCOPY WITH PROPOFOL  N/A 04/19/2016   Procedure: COLONOSCOPY WITH PROPOFOL ;  Surgeon: Gladis RAYMOND Mariner, MD;  Location: [facility] ENDOSCOPY;  Service: Endoscopy;  Laterality: N/A;    Family History  Problem Relation Age of Onset   Heart disease Father    Cancer Maternal Grandmother        Stomach    Social History   Tobacco Use   Smoking status: Never   Smokeless tobacco: Never  Substance Use Topics   Alcohol use: Yes    Alcohol/week: 2.0 standard drinks of alcohol    Types: 2 Standard drinks or equivalent per week    Comment: occasionally     Current Outpatient Medications:    cetirizine  (ZYRTEC  ALLERGY) 10 MG tablet, Take 1 tablet (10 mg total) by mouth at bedtime., Disp: 90 tablet, Rfl: 3   Cholecalciferol (VITAMIN D ) 50 MCG (2000 UT) CAPS, Take 1  capsule by mouth daily at 12 noon., Disp: , Rfl:    cyanocobalamin (VITAMIN B12) 1000 MCG tablet, Take 1,000 mcg by mouth daily., Disp: , Rfl:    fenofibrate  (TRICOR ) 145 MG tablet, Take 1 tablet (145 mg total) by mouth daily., Disp: 90 tablet, Rfl: 1   rosuvastatin  (CRESTOR ) 10 MG tablet, Take 1 tablet (10 mg total) by mouth daily., Disp: 90 tablet, Rfl: 1   azithromycin  (ZITHROMAX ) 500 MG tablet, Take 1 tablet (500 mg total) by mouth  daily. (Patient not taking: Reported on 02/15/2024), Disp: 3 tablet, Rfl: 0   fluticasone  (FLONASE ) 50 MCG/ACT nasal spray, Place 2 sprays into both nostrils daily. (Patient not taking: Reported on 02/15/2024), Disp: 16 g, Rfl: 1   fluticasone  (FLONASE ) 50 MCG/ACT nasal spray, Place 1 spray into both nostrils daily., Disp: 16 g, Rfl: 11   predniSONE  (DELTASONE ) 10 MG tablet, Take 1 tablet (10 mg total) by mouth 2 (two) times daily with a meal. (Patient not taking: Reported on 02/15/2024), Disp: 10 tablet, Rfl: 0  No Known Allergies  I personally reviewed active problem list, medication list, allergies, family history with the patient/caregiver today.   ROS  Ten systems reviewed and is negative except as mentioned in HPI    Objective Physical Exam  CONSTITUTIONAL: Patient appears well-developed and well-nourished.  No distress. HEENT: Head atraumatic, normocephalic, neck supple. CARDIOVASCULAR: Normal rate, regular rhythm and normal heart sounds.  No murmur heard. No BLE edema. PULMONARY: Effort normal and breath sounds normal. No respiratory distress. ABDOMINAL: There is no tenderness or distention. MUSCULOSKELETAL: Normal gait. Without gross motor or sensory deficit. PSYCHIATRIC: Patient has a normal mood and affect. behavior is normal. Judgment and thought content normal.  Vitals:   02/15/24 1121  BP: 126/76  Pulse: 60  Resp: 16  SpO2: 100%  Weight: 170 lb 3.2 oz (77.2 kg)  Height: 5' 3 (1.6 m)    Body mass index is 30.15 kg/m.    PHQ2/9:    02/15/2024   11:16 AM 07/27/2023    8:55 AM 02/07/2023    2:31 PM 07/26/2022    2:54 PM 05/19/2022    7:51 AM  Depression screen PHQ 2/9  Decreased Interest 0 0 0 0 0  Down, Depressed, Hopeless 0 0 0 0 0  PHQ - 2 Score 0 0 0 0 0  Altered sleeping   0 0 0  Tired, decreased energy   0 0 0  Change in appetite   0 0 0  Feeling bad or failure about yourself    0 0 0  Trouble concentrating   0 0 0  Moving slowly or  fidgety/restless   0 0 0  Suicidal thoughts   0 0 0  PHQ-9 Score   0 0 0    phq 9 is negative  Fall Risk:    02/15/2024   11:16 AM 07/27/2023    8:55 AM 02/07/2023    2:31 PM 07/26/2022    2:54 PM 05/19/2022    7:50 AM  Fall Risk   Falls in the past year? 0 0 0 0 0  Number falls in past yr: 0 0 0  0  Injury with Fall? 0 0 0  0  Risk for fall due to : No Fall Risks No Fall Risks No Fall Risks No Fall Risks No Fall Risks  Follow up Falls evaluation completed Falls evaluation completed Falls prevention discussed Falls prevention discussed;Education provided;Falls evaluation completed Falls prevention discussed  Data saved with a previous flowsheet row definition      Assessment & Plan Atherosclerosis of the aorta and dyslipidemia Atherosclerosis and dyslipidemia well-managed. LDL improved to 85 mg/dL. Triglycerides decreased to 158 mg/dL. - Continue rosuvastatin  10 mg daily. - Continue fenofibrate  145 mg daily. - Encourage dietary modifications to reduce triglycerides.  Prediabetes Prediabetes improving with lifestyle changes. Hemoglobin A1c decreased to 5% range. Glucose levels slightly elevated. - Continue lifestyle modifications, including reducing carbohydrate intake and increasing physical activity.  Subclinical hypothyroidism Subclinical hypothyroidism present with high-normal TSH, asymptomatic. No medication required. - Monitor TSH levels periodically.  Vitamin D  deficiency Vitamin D  deficiency managed with supplementation. - Continue vitamin D  2000 IU daily.  Post-concussion syndrome Post-concussion syndrome persists with headaches, dizziness, and fatigue. Managed with rest and vestibular therapy. - Continue vestibular therapy exercises at home. - Continue taking Thursdays off as a rest day.

## 2024-02-16 ENCOUNTER — Other Ambulatory Visit: Payer: Self-pay

## 2024-02-16 ENCOUNTER — Other Ambulatory Visit: Payer: Self-pay | Admitting: Family Medicine

## 2024-02-16 ENCOUNTER — Other Ambulatory Visit (HOSPITAL_COMMUNITY): Payer: Self-pay

## 2024-02-16 DIAGNOSIS — E785 Hyperlipidemia, unspecified: Secondary | ICD-10-CM

## 2024-02-16 MED ORDER — FENOFIBRATE 145 MG PO TABS
145.0000 mg | ORAL_TABLET | Freq: Every day | ORAL | 1 refills | Status: DC
  Filled 2024-02-16: qty 90, 90d supply, fill #0
  Filled 2024-04-26: qty 90, 90d supply, fill #1

## 2024-04-18 ENCOUNTER — Ambulatory Visit: Payer: Self-pay

## 2024-04-18 NOTE — Telephone Encounter (Signed)
 FYI Only or Action Required?: Action required by provider: request for appointment.  Patient was last seen in primary care on 02/15/2024 by Glenard Mire, MD.  Called Nurse Triage reporting Wheezing.  Symptoms began several days ago.  Interventions attempted: Nothing.  Symptoms are: unchanged.Has wheezing, no SOB , no cold symptoms. No availability, will go to UC.  Triage Disposition: See PCP Within 2 Weeks  Patient/caregiver understands and will follow disposition?: Yes    Copied from CRM [id number]. Topic: Clinical - Red Word Triage >> Apr 18, 2024  8:20 AM Ivette P wrote: Red Word that prompted transfer to Nurse Triage: Wheezing when breathing. No pain, not a heavy wheezing. when bretahing and sleeping can hear it. Answer Assessment - Initial Assessment Questions 1. RESPIRATORY STATUS: Describe your breathing? (e.g., wheezing, shortness of breath, unable to speak, severe coughing)      wheezing 2. ONSET: When did this breathing problem begin?      Monday 3. PATTERN Does the difficult breathing come and go, or has it been constant since it started?      Comes and goes 4. SEVERITY: How bad is your breathing? (e.g., mild, moderate, severe)      N/a 5. RECURRENT SYMPTOM: Have you had difficulty breathing before? If Yes, ask: When was the last time? and What happened that time?      no 6. CARDIAC HISTORY: Do you have any history of heart disease? (e.g., heart attack, angina, bypass surgery, angioplasty)      no 7. LUNG HISTORY: Do you have any history of lung disease?  (e.g., pulmonary embolus, asthma, emphysema)     no 8. CAUSE: What do you think is causing the breathing problem?      unsure 9. OTHER SYMPTOMS: Do you have any other symptoms? (e.g., chest pain, cough, dizziness, fever, runny nose)     no 10. O2 SATURATION MONITOR:  Do you use an oxygen saturation monitor (pulse oximeter) at home? If Yes, ask: What is your reading (oxygen level)  today? What is your usual oxygen saturation reading? (e.g., 95%)       o 11. PREGNANCY: Is there any chance you are pregnant? When was your last menstrual period?       no 12. TRAVEL: Have you traveled out of the country in the last month? (e.g., travel history, exposures)       no  Protocols used: Breathing Difficulty-A-AH  Reason for Disposition  Oxygen level (e.g., pulse oximetry) 91 to 94%  Answer Assessment - Initial Assessment Questions 1. RESPIRATORY STATUS: Describe your breathing? (e.g., wheezing, shortness of breath, unable to speak, severe coughing)      wheezing 2. ONSET: When did this breathing problem begin?      Monday 3. PATTERN Does the difficult breathing come and go, or has it been constant since it started?      Comes and goes 4. SEVERITY: How bad is your breathing? (e.g., mild, moderate, severe)      N/a 5. RECURRENT SYMPTOM: Have you had difficulty breathing before? If Yes, ask: When was the last time? and What happened that time?      no 6. CARDIAC HISTORY: Do you have any history of heart disease? (e.g., heart attack, angina, bypass surgery, angioplasty)      no 7. LUNG HISTORY: Do you have any history of lung disease?  (e.g., pulmonary embolus, asthma, emphysema)     no 8. CAUSE: What do you think is causing the breathing problem?  unsure 9. OTHER SYMPTOMS: Do you have any other symptoms? (e.g., chest pain, cough, dizziness, fever, runny nose)     no 10. O2 SATURATION MONITOR:  Do you use an oxygen saturation monitor (pulse oximeter) at home? If Yes, ask: What is your reading (oxygen level) today? What is your usual oxygen saturation reading? (e.g., 95%)       o 11. PREGNANCY: Is there any chance you are pregnant? When was your last menstrual period?       no 12. TRAVEL: Have you traveled out of the country in the last month? (e.g., travel history, exposures)       no  Protocols used: Breathing  Difficulty-A-AH  Reason for Disposition  Oxygen level (e.g., pulse oximetry) 91 to 94%  [1] MILD longstanding difficulty breathing (e.g., minimal/no SOB at rest, SOB with walking, pulse < 100) AND [2] SAME as normal  Answer Assessment - Initial Assessment Questions 1. RESPIRATORY STATUS: Describe your breathing? (e.g., wheezing, shortness of breath, unable to speak, severe coughing)      wheezing 2. ONSET: When did this breathing problem begin?      Monday 3. PATTERN Does the difficult breathing come and go, or has it been constant since it started?      Comes and goes 4. SEVERITY: How bad is your breathing? (e.g., mild, moderate, severe)      N/a 5. RECURRENT SYMPTOM: Have you had difficulty breathing before? If Yes, ask: When was the last time? and What happened that time?      no 6. CARDIAC HISTORY: Do you have any history of heart disease? (e.g., heart attack, angina, bypass surgery, angioplasty)      no 7. LUNG HISTORY: Do you have any history of lung disease?  (e.g., pulmonary embolus, asthma, emphysema)     no 8. CAUSE: What do you think is causing the breathing problem?      unsure 9. OTHER SYMPTOMS: Do you have any other symptoms? (e.g., chest pain, cough, dizziness, fever, runny nose)     no 10. O2 SATURATION MONITOR:  Do you use an oxygen saturation monitor (pulse oximeter) at home? If Yes, ask: What is your reading (oxygen level) today? What is your usual oxygen saturation reading? (e.g., 95%)       o 11. PREGNANCY: Is there any chance you are pregnant? When was your last menstrual period?       no 12. TRAVEL: Have you traveled out of the country in the last month? (e.g., travel history, exposures)       no  Protocols used: Breathing Difficulty-A-AH  Reason for Disposition  [1] MILD longstanding difficulty breathing (e.g., minimal/no SOB at rest, SOB with walking, pulse < 100) AND [2] SAME as normal  Answer Assessment - Initial  Assessment Questions 1. RESPIRATORY STATUS: Describe your breathing? (e.g., wheezing, shortness of breath, unable to speak, severe coughing)      wheezing 2. ONSET: When did this breathing problem begin?      Monday 3. PATTERN Does the difficult breathing come and go, or has it been constant since it started?      Comes and goes 4. SEVERITY: How bad is your breathing? (e.g., mild, moderate, severe)      N/a 5. RECURRENT SYMPTOM: Have you had difficulty breathing before? If Yes, ask: When was the last time? and What happened that time?      no 6. CARDIAC HISTORY: Do you have any history of heart disease? (e.g., heart attack, angina, bypass surgery,  angioplasty)      no 7. LUNG HISTORY: Do you have any history of lung disease?  (e.g., pulmonary embolus, asthma, emphysema)     no 8. CAUSE: What do you think is causing the breathing problem?      unsure 9. OTHER SYMPTOMS: Do you have any other symptoms? (e.g., chest pain, cough, dizziness, fever, runny nose)     no 10. O2 SATURATION MONITOR:  Do you use an oxygen saturation monitor (pulse oximeter) at home? If Yes, ask: What is your reading (oxygen level) today? What is your usual oxygen saturation reading? (e.g., 95%)       o 11. PREGNANCY: Is there any chance you are pregnant? When was your last menstrual period?       no 12. TRAVEL: Have you traveled out of the country in the last month? (e.g., travel history, exposures)       no  Protocols used: Breathing Difficulty-A-AH

## 2024-04-26 ENCOUNTER — Other Ambulatory Visit: Payer: Self-pay | Admitting: Family Medicine

## 2024-04-26 ENCOUNTER — Other Ambulatory Visit: Payer: Self-pay

## 2024-04-26 DIAGNOSIS — I700 Atherosclerosis of aorta: Secondary | ICD-10-CM

## 2024-04-26 DIAGNOSIS — E785 Hyperlipidemia, unspecified: Secondary | ICD-10-CM

## 2024-04-29 ENCOUNTER — Other Ambulatory Visit: Payer: Self-pay

## 2024-04-29 ENCOUNTER — Other Ambulatory Visit: Payer: Self-pay | Admitting: Family Medicine

## 2024-04-29 ENCOUNTER — Telehealth: Payer: Self-pay

## 2024-04-29 DIAGNOSIS — E785 Hyperlipidemia, unspecified: Secondary | ICD-10-CM

## 2024-04-29 DIAGNOSIS — I700 Atherosclerosis of aorta: Secondary | ICD-10-CM

## 2024-04-29 MED ORDER — FENOFIBRATE 134 MG PO CAPS: 134.0000 mg | ORAL_CAPSULE | Freq: Every day | ORAL | 1 refills | Status: AC

## 2024-04-29 NOTE — Telephone Encounter (Signed)
 Copied from CRM [id number]. Topic: Clinical - Prescription Issue >> Apr 29, 2024  9:03 AM Olam RAMAN wrote: Reason for CRM: Provider sent in rx for fenofibrate  (TRICOR ) 145 MG tablet was not covered but what is covered is 134 mg is covered by ins and asked to resend Cb  [phone]

## 2024-05-01 ENCOUNTER — Other Ambulatory Visit: Payer: Self-pay

## 2024-05-01 MED FILL — Rosuvastatin Calcium Tab 10 MG: ORAL | 90 days supply | Qty: 90 | Fill #0 | Status: CN

## 2024-05-01 NOTE — Telephone Encounter (Signed)
 Requested Prescriptions  Pending Prescriptions Disp Refills   rosuvastatin  (CRESTOR ) 10 MG tablet 90 tablet 1    Sig: Take 1 tablet (10 mg total) by mouth daily.     Cardiovascular:  Antilipid - Statins 2 Failed - 05/01/2024  9:52 AM      Failed - Lipid Panel in normal range within the last 12 months    Cholesterol, Total  Date Value Ref Range Status  07/31/2023 163 100 - 199 mg/dL Final   LDL Chol Calc (NIH)  Date Value Ref Range Status  07/31/2023 85 0 - 99 mg/dL Final   HDL  Date Value Ref Range Status  07/31/2023 51 >39 mg/dL Final   Triglycerides  Date Value Ref Range Status  07/31/2023 158 (H) 0 - 149 mg/dL Final         Passed - Cr in normal range and within 360 days    Creatinine, Ser  Date Value Ref Range Status  07/31/2023 0.74 0.57 - 1.00 mg/dL Final         Passed - Patient is not pregnant      Passed - Valid encounter within last 12 months    Recent Outpatient Visits           2 months ago Dyslipidemia   [facility] Glenard Mire, MD   8 months ago Well adult exam   [facility] Glenard Mire, MD   9 months ago Flank pain   [facility] Bernardo Fend, DO       Future Appointments             In 3 months Glenard, Krichna, MD [facility], [place]

## 2024-05-03 ENCOUNTER — Other Ambulatory Visit: Payer: Self-pay

## 2024-05-03 DIAGNOSIS — I700 Atherosclerosis of aorta: Secondary | ICD-10-CM

## 2024-05-03 DIAGNOSIS — E785 Hyperlipidemia, unspecified: Secondary | ICD-10-CM

## 2024-05-03 MED ORDER — ROSUVASTATIN CALCIUM 10 MG PO TABS: 10.0000 mg | ORAL_TABLET | Freq: Every day | ORAL | 1 refills | Status: AC

## 2024-05-05 ENCOUNTER — Other Ambulatory Visit: Payer: Self-pay

## 2024-05-06 ENCOUNTER — Other Ambulatory Visit: Payer: Self-pay | Admitting: Family Medicine

## 2024-05-06 ENCOUNTER — Telehealth: Payer: Self-pay

## 2024-05-06 DIAGNOSIS — Z1231 Encounter for screening mammogram for malignant neoplasm of breast: Secondary | ICD-10-CM

## 2024-05-06 NOTE — Telephone Encounter (Signed)
 Copied from CRM [id number]. Topic: Clinical - Prescription Issue >> May 06, 2024  3:39 PM Shasta L wrote: Reason for CRM: Patient says tablet does not look like it usually does.It was white before now its blue. Want to confirm its the correct medication fenofibrate  micronized (LOFIBRA) 134 MG capsule.

## 2024-05-06 NOTE — Telephone Encounter (Signed)
 Advised pt she will need to contact pharmacy.

## 2024-05-08 ENCOUNTER — Ambulatory Visit: Payer: Self-pay

## 2024-05-08 NOTE — Telephone Encounter (Signed)
" ° °  Copied from CRM [id number]. Topic: Clinical - Red Word Triage >> May 08, 2024  8:24 AM Harlene ORN wrote: Red Word that prompted transfer to Nurse Triage: cough that has been lingering for a while; dizziness but no fever >> May 08, 2024  8:42 AM Revonda D wrote: Pt is calling back to speak with a nurse about this issue. Pt stated that the call dropped.  "

## 2024-05-08 NOTE — Telephone Encounter (Signed)
" °  FYI Only or Action Required?: FYI only for provider: appointment scheduled on 1/8, wanted appt tomorrow.  Patient was last seen in primary care on 02/15/2024 by Glenard Mire, MD.  Called Nurse Triage reporting Cough.  Symptoms began 2 weeks ago.  Interventions attempted: OTC medications: cough syrup.  Symptoms are: gradually worsening.  Triage Disposition: See HCP Within 4 Hours (Or PCP Triage)  Patient/caregiver understands and will follow disposition?: Yes, but will wait   Reason for Disposition  [1] MILD difficulty breathing (e.g., minimal/no SOB at rest, SOB with walking, pulse < 100) AND [2] still present when not coughing  Answer Assessment - Initial Assessment Questions 1. ONSET: When did the cough begin?      2 wks ago 2. SEVERITY: How bad is the cough today?      Has coughing fits 3. SPUTUM: Describe the color of your sputum (e.g., none, dry cough; clear, white, yellow, green)     denies 4. HEMOPTYSIS: Are you coughing up any blood? If Yes, ask: How much? (e.g., flecks, streaks, tablespoons, etc.)     denies 5. DIFFICULTY BREATHING: Are you having difficulty breathing? If Yes, ask: How bad is it? (e.g., mild, moderate, severe)      Sob with activity 6. FEVER: Do you have a fever? If Yes, ask: What is your temperature, how was it measured, and when did it start?     denies 7. CARDIAC HISTORY: Do you have any history of heart disease? (e.g., heart attack, congestive heart failure)      no 8. LUNG HISTORY: Do you have any history of lung disease?  (e.g., pulmonary embolus, asthma, emphysema)     no 9. PE RISK FACTORS: Do you have a history of blood clots? (or: recent major surgery, recent prolonged travel, bedridden)      10. OTHER SYMPTOMS: Do you have any other symptoms? (e.g., runny nose, wheezing, chest pain)       no  Protocols used: Cough - Acute Non-Productive-A-AH  Copied from CRM #[id number]. Topic: Clinical - Red Word Triage >>  May 08, 2024  8:24 AM Harlene ORN wrote: Red Word that prompted transfer to Nurse Triage: cough that has been lingering for a while; dizziness but no fever >> May 08, 2024  8:42 AM Revonda D wrote: Pt is calling back to speak with a nurse about this issue. Pt stated that the call dropped.      "

## 2024-05-08 NOTE — Telephone Encounter (Signed)
 Pt disconnected with Jessica PAS. Attempted to call pt back at [phone], no answer. LVM with pt and instructed her to call back at [phone]. Placed in call backs.   Copied from CRM [id number]. Topic: Clinical - Red Word Triage >> May 08, 2024  8:24 AM Harlene ORN wrote: Red Word that prompted transfer to Nurse Triage: cough that has been lingering for a while; dizziness but no fever

## 2024-05-09 ENCOUNTER — Encounter: Payer: Self-pay | Admitting: Family Medicine

## 2024-05-09 ENCOUNTER — Ambulatory Visit: Admitting: Family Medicine

## 2024-05-09 VITALS — BP 124/72 | HR 62 | Temp 97.4°F | Resp 16 | Ht 63.0 in | Wt 171.7 lb

## 2024-05-09 DIAGNOSIS — J208 Acute bronchitis due to other specified organisms: Secondary | ICD-10-CM

## 2024-05-09 DIAGNOSIS — R052 Subacute cough: Secondary | ICD-10-CM

## 2024-05-09 MED ORDER — AIRSUPRA 90-80 MCG/ACT IN AERO: 2.0000 | INHALATION_SPRAY | Freq: Four times a day (QID) | RESPIRATORY_TRACT | 0 refills | Status: AC

## 2024-05-09 MED ORDER — BENZONATATE 100 MG PO CAPS: 100.0000 mg | ORAL_CAPSULE | Freq: Three times a day (TID) | ORAL | 0 refills | Status: AC | PRN

## 2024-05-09 NOTE — Progress Notes (Signed)
 Name: Autumn Long   MRN: [id number]    DOB: 1965/11/07   Date:05/09/2024       Progress Note  Subjective  Chief Complaint  Chief Complaint  Patient presents with   Cough    X2 weeks taking OTC med not helping, feels chest congestion    Discussed the use of AI scribe software for clinical note transcription with the patient, who gave verbal consent to proceed.  History of Present Illness Autumn Long is a 59 year old female who presents with a persistent cough for two weeks.  She has been experiencing a persistent cough for about two weeks. Initially, she considered visiting urgent care but opted for over-the-counter medication due to long wait times. She has been using Testim DM, completing two bottles, which provides temporary relief for a few hours. The cough is occasionally productive with mucus and is accompanied by a sensation of heaviness and slight pain on the side of her chest. She also experiences dizziness and her voice has been affected by the persistent coughing.  No fever or chills. She mentions slight headaches, which she attributes to screen time at work. She experiences mild rhinorrhea, which she associates with sinus issues, but does not recall having cold symptoms prior to the onset of the cough. Her current medications include Testim DM for cough and congestion, and she has Zyrtec  at home for allergies, although she has not been taking it. She uses home remedies such as warm water with honey, lemon juice, and ginger to manage her symptoms. She feels tired but is able to sleep at night after taking her medication.  Her kids  have been sick, but her husband has not shown any symptoms.    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, initial encounter (HCC) 10/09/2019   Dyslipidemia 10/18/2014   Keratosis pilaris 10/18/2014   Subclinical hypothyroidism 10/18/2014   Vitamin D  deficiency 10/18/2014   History of Hashimoto thyroiditis 03/18/2013    Social History   Tobacco Use   Smoking status: Never   Smokeless tobacco: Never  Substance Use Topics   Alcohol use: Yes    Alcohol/week: 2.0 standard drinks of alcohol    Types: 2 Standard drinks or equivalent per week    Comment: occasionally    Current Medications[1]  Allergies[2]  ROS  Ten systems reviewed and is negative except as mentioned in HPI    Objective  Vitals:   05/09/24 0945  BP: 124/72  Pulse: 62  Resp: 16  Temp: (!) 97.4 F (36.3 C)  TempSrc: Oral  SpO2: 97%  Weight: 171 lb 11.2 oz (77.9 kg)  Height: 5' 3 (1.6 m)    Body mass index is 30.42 kg/m.   Physical Exam CONSTITUTIONAL: Patient appears well-developed and well-nourished. No distress. HEENT: Head atraumatic, normocephalic, neck supple. CARDIOVASCULAR: Normal rate, regular rhythm and normal heart sounds. No murmur heard. No BLE edema. PULMONARY: Effort normal and breath sounds normal. No respiratory distress. No crackles or wheezing. Scattered rhonchi  ABDOMINAL: There is no tenderness or distention. MUSCULOSKELETAL: Normal gait. Without gross motor or sensory deficit. PSYCHIATRIC: Patient has a normal mood and affect. Behavior is normal. Judgment and thought content normal.   Assessment & Plan Viral bronchitis with subacute cough Subacute cough for two weeks due to viral bronchitis. No bacterial infection, no antibiotics needed. - Prescribed  Airsupra  inhaler for bronchodilation and inflammation. Instructed on inhaler use: shake, inhale, hold breath. Use up to four times daily. - Prescribed Tessalon  Perles for cough suppression, non-drowsy, up to three times daily. - Provided inhaler coupon to reduce cost. - Advised to monitor for fever or increased phlegm and contact via MyChart if symptoms worsen.             [1]  Current Outpatient Medications:    Albuterol-Budesonide (AIRSUPRA ) 90-80 MCG/ACT AERO, Inhale 2 puffs into the lungs in the morning, at noon, in the evening, and at bedtime., Disp: 10.7 g, Rfl: 0   benzonatate  (TESSALON ) 100 MG capsule, Take 1-2 capsules (100-200 mg total) by mouth 3 (three) times daily as needed for cough., Disp: 40 capsule, Rfl: 0   cetirizine  (ZYRTEC  ALLERGY) 10 MG tablet, Take 1 tablet (10 mg total) by mouth at bedtime., Disp: 90 tablet, Rfl: 3   Cholecalciferol (VITAMIN D ) 50 MCG (2000 UT) CAPS, Take 1 capsule by mouth daily at 12 noon., Disp: , Rfl:    cyanocobalamin (VITAMIN B12) 1000 MCG tablet, Take 1,000 mcg by mouth daily., Disp: , Rfl:    fenofibrate  micronized (LOFIBRA) 134 MG capsule, Take 1 capsule (134 mg total) by mouth daily before breakfast., Disp: 90 capsule, Rfl: 1   rosuvastatin  (CRESTOR ) 10 MG tablet, Take 1 tablet (10 mg total) by mouth daily., Disp: 90 tablet, Rfl: 1 [2] No Known Allergies

## 2024-05-20 ENCOUNTER — Other Ambulatory Visit: Payer: Self-pay | Admitting: Family Medicine

## 2024-05-20 DIAGNOSIS — R052 Subacute cough: Secondary | ICD-10-CM

## 2024-05-21 NOTE — Telephone Encounter (Signed)
 Requested medications are due for refill today.  unsure  Requested medications are on the active medications list.  yes  Last refill. 05/09/2024 10.7 g 0 rf  Future visit scheduled.   yes  Notes to clinic.  Medication not assigned to a protocol. Please review for refill.     Requested Prescriptions  Pending Prescriptions Disp Refills   AIRSUPRA  90-80 MCG/ACT AERO [Pharmacy Med Name: AIRSUPRA  90-80 MCG INHALER] 10.7 g 0    Sig: Inhale 2 puffs into the lungs in the morning, at noon, in the evening, and at bedtime.     Off-Protocol Failed - 05/21/2024 11:03 AM      Failed - Medication not assigned to a protocol, review manually.      Passed - Valid encounter within last 12 months    Recent Outpatient Visits           1 week ago Subacute cough   [place] [facility] Glenard Mire, MD   3 months ago Dyslipidemia   [facility] Glenard Mire, MD   9 months ago Well adult exam   [facility] Glenard Mire, MD   9 months ago Flank pain   [facility] Bernardo Fend, DO       Future Appointments             In 2 months Glenard, Krichna, MD [facility], [place]

## 2024-08-08 ENCOUNTER — Encounter: Admitting: Family Medicine

## 2024-08-09 ENCOUNTER — Encounter: Admitting: Family Medicine

## 2024-10-10 ENCOUNTER — Encounter

## 2025-02-13 ENCOUNTER — Ambulatory Visit: Admitting: Family Medicine
